# Patient Record
Sex: Male | Born: 1945 | Race: White | Hispanic: No | Marital: Single | Smoking: Never smoker
Health system: Southern US, Community
[De-identification: ages and names within clinical notes are randomized; demographics above are authoritative.]

## PROBLEM LIST (undated history)

## (undated) DIAGNOSIS — K759 Inflammatory liver disease, unspecified: Secondary | ICD-10-CM

## (undated) DIAGNOSIS — K219 Gastro-esophageal reflux disease without esophagitis: Secondary | ICD-10-CM

## (undated) DIAGNOSIS — M1990 Unspecified osteoarthritis, unspecified site: Secondary | ICD-10-CM

## (undated) HISTORY — PX: TONSILLECTOMY: SUR1361

---

## 1997-11-12 ENCOUNTER — Ambulatory Visit (HOSPITAL_BASED_OUTPATIENT_CLINIC_OR_DEPARTMENT_OTHER): Admission: RE | Admit: 1997-11-12 | Discharge: 1997-11-12 | Payer: Self-pay | Admitting: Orthopedic Surgery

## 2001-08-23 ENCOUNTER — Encounter: Payer: Self-pay | Admitting: Family Medicine

## 2001-08-23 ENCOUNTER — Encounter: Admission: RE | Admit: 2001-08-23 | Discharge: 2001-08-23 | Payer: Self-pay | Admitting: Family Medicine

## 2002-06-14 ENCOUNTER — Ambulatory Visit (HOSPITAL_BASED_OUTPATIENT_CLINIC_OR_DEPARTMENT_OTHER): Admission: RE | Admit: 2002-06-14 | Discharge: 2002-06-14 | Payer: Self-pay | Admitting: Orthopedic Surgery

## 2002-07-11 ENCOUNTER — Ambulatory Visit (HOSPITAL_COMMUNITY): Admission: RE | Admit: 2002-07-11 | Discharge: 2002-07-11 | Payer: Self-pay | Admitting: Gastroenterology

## 2002-07-11 ENCOUNTER — Encounter (INDEPENDENT_AMBULATORY_CARE_PROVIDER_SITE_OTHER): Payer: Self-pay | Admitting: Specialist

## 2004-11-03 ENCOUNTER — Encounter: Admission: RE | Admit: 2004-11-03 | Discharge: 2004-11-03 | Payer: Self-pay | Admitting: Family Medicine

## 2008-01-12 ENCOUNTER — Encounter: Admission: RE | Admit: 2008-01-12 | Discharge: 2008-01-12 | Payer: Self-pay | Admitting: Family Medicine

## 2008-01-14 ENCOUNTER — Encounter: Admission: RE | Admit: 2008-01-14 | Discharge: 2008-01-14 | Payer: Self-pay | Admitting: Family Medicine

## 2008-02-02 HISTORY — PX: JOINT REPLACEMENT: SHX530

## 2008-12-02 ENCOUNTER — Inpatient Hospital Stay (HOSPITAL_COMMUNITY): Admission: RE | Admit: 2008-12-02 | Discharge: 2008-12-04 | Payer: Self-pay | Admitting: Orthopedic Surgery

## 2010-05-06 LAB — PROTIME-INR
INR: 1.09 (ref 0.00–1.49)
INR: 1.33 (ref 0.00–1.49)
Prothrombin Time: 14 seconds (ref 11.6–15.2)
Prothrombin Time: 16.4 seconds — ABNORMAL HIGH (ref 11.6–15.2)

## 2010-05-06 LAB — CBC
HCT: 34.8 % — ABNORMAL LOW (ref 39.0–52.0)
HCT: 36 % — ABNORMAL LOW (ref 39.0–52.0)
Hemoglobin: 12.2 g/dL — ABNORMAL LOW (ref 13.0–17.0)
Hemoglobin: 12.7 g/dL — ABNORMAL LOW (ref 13.0–17.0)
MCHC: 35.1 g/dL (ref 30.0–36.0)
MCHC: 35.2 g/dL (ref 30.0–36.0)
MCV: 99.4 fL (ref 78.0–100.0)
MCV: 99.4 fL (ref 78.0–100.0)
Platelets: 206 10*3/uL (ref 150–400)
Platelets: 207 10*3/uL (ref 150–400)
RBC: 3.5 MIL/uL — ABNORMAL LOW (ref 4.22–5.81)
RBC: 3.62 MIL/uL — ABNORMAL LOW (ref 4.22–5.81)
RDW: 12.1 % (ref 11.5–15.5)
RDW: 12.1 % (ref 11.5–15.5)
WBC: 12.6 10*3/uL — ABNORMAL HIGH (ref 4.0–10.5)
WBC: 14.6 10*3/uL — ABNORMAL HIGH (ref 4.0–10.5)

## 2010-05-06 LAB — BASIC METABOLIC PANEL
BUN: 6 mg/dL (ref 6–23)
CO2: 30 mEq/L (ref 19–32)
Calcium: 8.3 mg/dL — ABNORMAL LOW (ref 8.4–10.5)
Chloride: 97 mEq/L (ref 96–112)
Creatinine, Ser: 1.13 mg/dL (ref 0.4–1.5)
GFR calc Af Amer: >60 mL/min (ref >60)
GFR calc non Af Amer: >60 mL/min (ref >60)
Glucose, Bld: 154 mg/dL — ABNORMAL HIGH (ref 70–99)
Potassium: 4.2 mEq/L (ref 3.5–5.1)
Sodium: 132 mEq/L — ABNORMAL LOW (ref 135–145)

## 2010-05-06 LAB — TYPE AND SCREEN
ABO/RH(D): O POS
Antibody Screen: NEGATIVE

## 2010-05-06 LAB — ABO/RH: ABO/RH(D): O POS

## 2010-05-07 LAB — CBC
HCT: 44 % (ref 39.0–52.0)
Hemoglobin: 15.5 g/dL (ref 13.0–17.0)
MCHC: 35.2 g/dL (ref 30.0–36.0)
MCV: 97.8 fL (ref 78.0–100.0)
Platelets: 269 10*3/uL (ref 150–400)
RBC: 4.5 MIL/uL (ref 4.22–5.81)
RDW: 12.2 % (ref 11.5–15.5)
WBC: 7.9 10*3/uL (ref 4.0–10.5)

## 2010-05-07 LAB — BASIC METABOLIC PANEL
BUN: 13 mg/dL (ref 6–23)
CO2: 29 mEq/L (ref 19–32)
Calcium: 9.6 mg/dL (ref 8.4–10.5)
Chloride: 106 mEq/L (ref 96–112)
Creatinine, Ser: 1.05 mg/dL (ref 0.4–1.5)
GFR calc Af Amer: >60 mL/min (ref >60)
GFR calc non Af Amer: >60 mL/min (ref >60)
Glucose, Bld: 99 mg/dL (ref 70–99)
Potassium: 4 mEq/L (ref 3.5–5.1)
Sodium: 141 mEq/L (ref 135–145)

## 2010-05-07 LAB — URINALYSIS, ROUTINE W REFLEX MICROSCOPIC
Bilirubin Urine: NEGATIVE
Glucose, UA: NEGATIVE mg/dL
Hgb urine dipstick: NEGATIVE
Ketones, ur: NEGATIVE mg/dL
Nitrite: NEGATIVE
Protein, ur: NEGATIVE mg/dL
Specific Gravity, Urine: 1.018 (ref 1.005–1.030)
Urobilinogen, UA: 0.2 mg/dL (ref 0.0–1.0)
pH: 8 (ref 5.0–8.0)

## 2010-05-07 LAB — DIFFERENTIAL
Basophils Absolute: 0 10*3/uL (ref 0.0–0.1)
Basophils Relative: 0 % (ref 0–1)
Eosinophils Absolute: 0.2 10*3/uL (ref 0.0–0.7)
Eosinophils Relative: 3 % (ref 0–5)
Lymphocytes Relative: 28 % (ref 12–46)
Lymphs Abs: 2.2 10*3/uL (ref 0.7–4.0)
Monocytes Absolute: 0.6 10*3/uL (ref 0.1–1.0)
Monocytes Relative: 8 % (ref 3–12)
Neutro Abs: 4.8 10*3/uL (ref 1.7–7.7)
Neutrophils Relative %: 61 % (ref 43–77)

## 2010-05-07 LAB — PROTIME-INR
INR: 0.89 (ref 0.00–1.49)
Prothrombin Time: 12 seconds (ref 11.6–15.2)

## 2010-05-07 LAB — APTT: aPTT: 27 seconds (ref 24–37)

## 2010-06-19 NOTE — Op Note (Signed)
NAME:  Walter Massey, Walter Massey                          ACCOUNT NO.:  [id number]   MEDICAL RECORD NO.:  [id number]                   PATIENT TYPE:  AMB   LOCATION:  DSC                                  FACILITY:  [facility]   PHYSICIAN:  Katy Fitch. Walter Massey., M.D.          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   OPERATION PERFORMED:  Dorsal arthrotomy of right thumb interphalangeal joint  with debridement of large mucous cyst and excision of kissing osteophytes on  adjacent surfaces of dorsal radial aspect of distal and proximal phalanges.  Also limited capsulectomy at ulnar aspect of interphalangeal joint.   SURGEON:  Katy Fitch. Sypher, M.D.   ASSISTANT:  Jonni Sanger, P.A.   ANESTHESIA:  0.25% Marcaine and 2% lidocaine metacarpal head level block of  right thumb supplemented with IV sedation.   SUPERVISING ANESTHESIOLOGIST:  Janetta Hora. Gelene Mink, M.D.   INDICATIONS FOR PROCEDURE:  The patient is a 65 year old man with a history  of increasing pain his right thumb interphalangeal joint and a mass adjacent  to the base of his thumb nail.  Clinical examination suggested IP joint  arthropathy, marginal osteophyte formation and a large mucous cyst.  He does  not have significant nail deformity.  He requested excision of his mass.  We  recommended DIP joint debridement.  We explained that we could not control  the pain of his arthritis with a DIP joint debridement but could typically  remove the cyst and  prevent its recurrent by joint debridement.   DESCRIPTION OF PROCEDURE:  The patient was brought to the operating room and  placed in supine position upon the operating table.  Following light  sedation, the right arm was prepped with Betadine soap and solution and  sterilely draped.  A pneumatic tourniquet was applied to the proximal  brachium.  Following exsanguination of the right arm with an Esmarch  bandage, an arterial tourniquet on the proximal brachium was inflated to  .  The procedure commenced with infiltration of 0.25% Marcaine and 2%  lidocaine at metacarpal head level to obtain visual block.  When anesthesia  was satisfactory, the procedure commenced with a curvilinear incision  exposing the extensor mechanism and IP joint. There was a large multilobular  ganglion deep to the extensor mechanism.  This was exposed by elevating the  radial insertion of the extensor pollicis longus and the ganglion was  removed piecemeal with a rongeur.  A large dorsocutaneous sensory branch was  retracted throughout  dissection of the joint capsule.  The adjacent  osteophytes on the distal and proximal phalanges on the radial aspect of the  joint were excised.  A synovectomy of the radial aspect of the joint was  accomplished.  All visible ganglion membrane was excised.  Attention was  then directed to the ulnar aspect of the joint.  The capsule between the  ulnar collateral ligament and extensor pollicis longus was excised with a  rongeur.  There were no significant osteophytes on the ulnar aspect of the  joint that would likely provoke the development of ganglion.  The extensor  that was taken down to remove the ganglion was repaired with a mattress  suture of 4-0 Vicryl.  The wound was then carefully irrigated and repaired  with intradermal 0 Prolene.   The patient tolerated surgery and anesthesia well.  He was awakened from  sedation and transferred to recovery room with stable vital  signs.                                               Katy Fitch Walter Massey., M.D.    RVS/MEDQ  D:  06/14/2002  T:  06/15/2002  Job:  [id number]

## 2010-06-19 NOTE — Op Note (Signed)
   NAME:  Walter Massey, Walter Massey                          ACCOUNT NO.:  [id number]   MEDICAL RECORD NO.:  [id number]                   PATIENT TYPE:  AMB   LOCATION:  ENDO                                 FACILITY:  [facility]   PHYSICIAN:  Danise Edge, M.D.                DATE OF BIRTH:  May 21, 1945   DATE OF PROCEDURE:  07/11/2002  DATE OF DISCHARGE:                                 OPERATIVE REPORT   PROCEDURE:  Colonoscopy.   INDICATIONS FOR PROCEDURE:  Mr. Walter Massey is a 65 year old male born 02-07-1945. [name] had a week long history of intermittent hematochezia in  late April 2004. He was evaluated by Dr. Donia Guiles and diagnosed with  an anal fissure which was treated. His hematochezia has resolved. Mr.  Outman is scheduled for diagnostic colonoscopy with polypectomy to prevent  colon cancer.   ENDOSCOPIST:  Charolett Bumpers, M.D.   PREMEDICATION:  Versed 5 mg, Demerol 50 mg .   DESCRIPTION OF PROCEDURE:  After obtaining informed consent, [name] was  placed in the left lateral decubitus position. I administered intravenous  Demerol and intravenous Versed to achieve conscious sedation for the  procedure. The patient's blood pressure, oxygen saturation and cardiac  rhythm were monitored throughout the procedure and documented in the medical  record.   Anal inspection was normal. Digital rectal exam was normal. The prostate was  nonnodular. The Olympus adult colonoscope was introduced into the rectum and  advanced to the cecum. A normal appearing ileocecal valve was intubated and  the distal ileum inspected. Colonic preparation for the exam today was  excellent.   RECTUM:  From the mid rectum, a 1 mm sessile polyp was removed with the  electrocautery snare and a 0.5 mm sessile polyp was removed with the hot  biopsy forceps.   SIGMOID COLON AND DESCENDING COLON:  Normal.   SPLENIC FLEXURE:  Normal.   TRANSVERSE COLON:  Normal.   HEPATIC FLEXURE:  Normal.   ASCENDING COLON:  Normal.   CECUM AND ILEOCECAL VALVE:  Normal.   DISTAL ILEUM:  Normal.   ASSESSMENT:  Two small polyps were removed from the mid rectum;  otherwise  normal proctocolonoscopy to the cecum.   RECOMMENDATIONS:  If rectal polyps return neoplastic pathologically, Mr.  Larue should undergo a repeat colonoscopy in five years.                                               Danise Edge, M.D.    MJ/MEDQ  D:  07/11/2002  T:  07/11/2002  Job:  [id number]   cc:   Donia Guiles, M.D.  [address] [place]  [place] [address]  Fax: [phone]

## 2010-12-13 IMAGING — CR DG HIP 1V PORT*R*
1 series · 1 of 1 positions shown · non-contrast
Comparison: Preoperative radiographs 11/03/2004.

CLINICAL DATA: Postop right hip arthroplasty.

PORTABLE RIGHT HIP - 1 VIEW

[cross table lat hip]
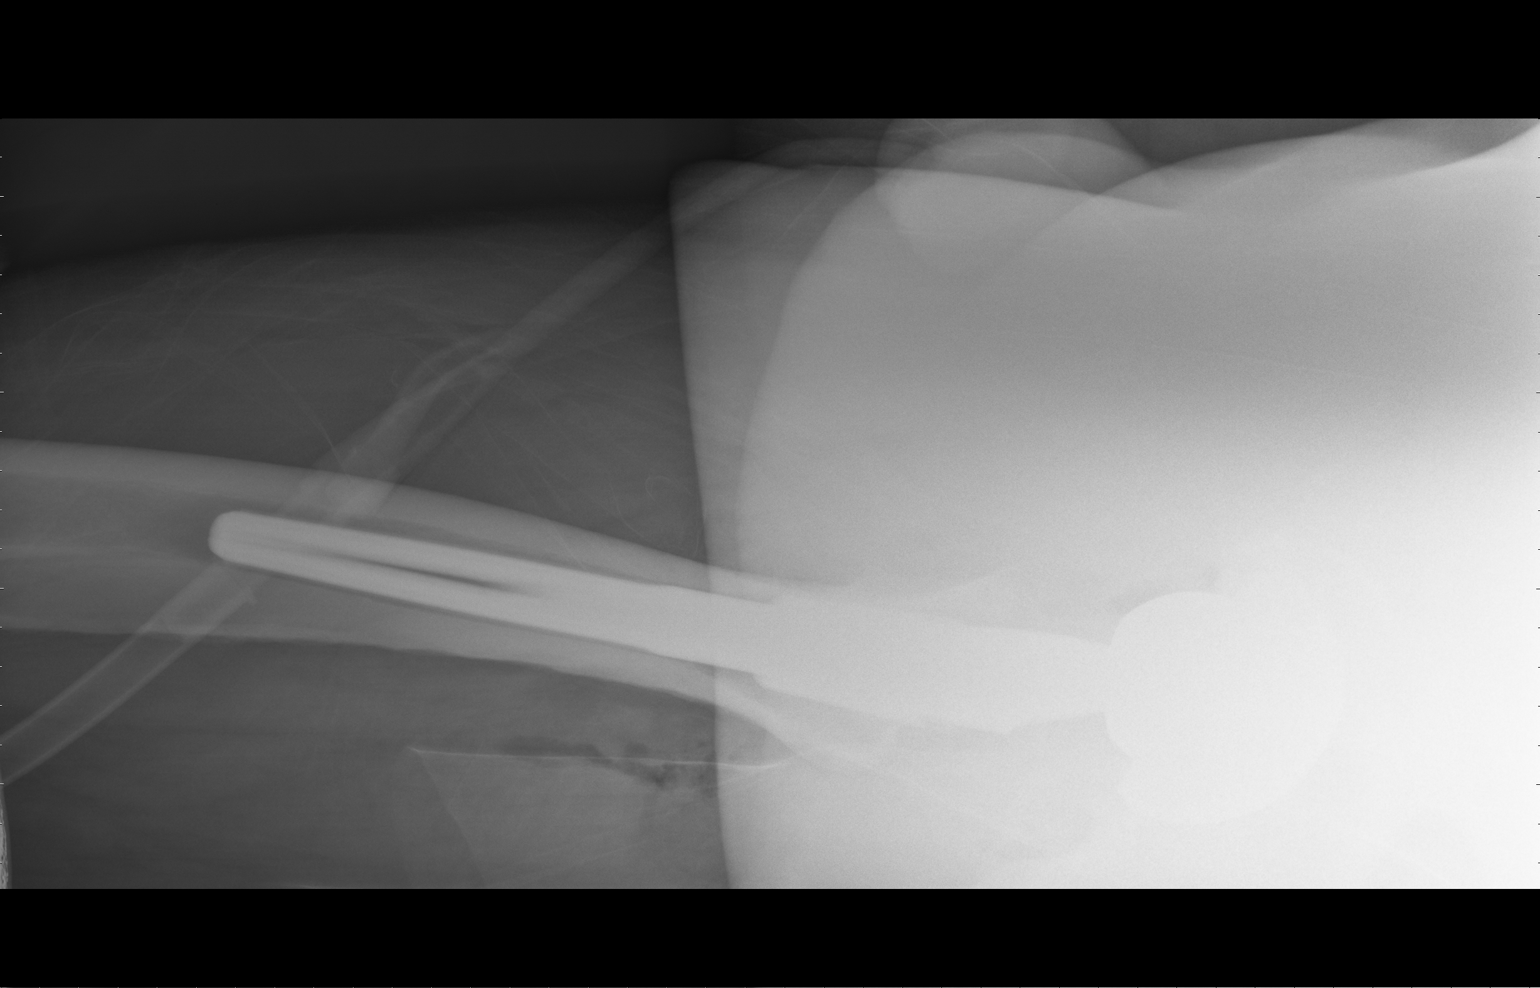

[1 of 1 positions shown; findings below may reference images not displayed]

FINDINGS: Cross-table lateral view at 5355 hours demonstrates a
right total hip arthroplasty without demonstrated complication.
There is no dislocation.  Some air is seen within the soft tissues
posterior to the proximal femur.
IMPRESSION: No demonstrated complication status post right total hip
replacement.

## 2011-05-19 ENCOUNTER — Other Ambulatory Visit: Payer: Self-pay | Admitting: Gastroenterology

## 2011-05-21 ENCOUNTER — Ambulatory Visit
Admission: RE | Admit: 2011-05-21 | Discharge: 2011-05-21 | Disposition: A | Discharge status: Home / Self Care | Payer: BC Managed Care – PPO | Source: Ambulatory Visit | Attending: Gastroenterology | Admitting: Gastroenterology

## 2012-02-02 HISTORY — PX: BACK SURGERY: SHX140

## 2012-09-07 ENCOUNTER — Other Ambulatory Visit: Payer: Self-pay | Admitting: Physical Medicine and Rehabilitation

## 2012-09-07 DIAGNOSIS — M545 Low back pain: Secondary | ICD-10-CM

## 2012-09-11 ENCOUNTER — Ambulatory Visit
Admission: RE | Admit: 2012-09-11 | Discharge: 2012-09-11 | Disposition: A | Discharge status: Home / Self Care | Payer: BC Managed Care – PPO | Source: Ambulatory Visit | Attending: Physical Medicine and Rehabilitation | Admitting: Physical Medicine and Rehabilitation

## 2012-09-11 VITALS — BP 154/87 | HR 63

## 2012-09-11 DIAGNOSIS — M545 Low back pain: Secondary | ICD-10-CM

## 2012-09-11 MED ORDER — IOHEXOL 180 MG/ML  SOLN
1.0000 mL | Freq: Once | INTRAMUSCULAR | Status: AC | PRN
  Administered 2012-09-11: 1 mL via EPIDURAL

## 2012-09-11 MED ORDER — METHYLPREDNISOLONE ACETATE 40 MG/ML INJ SUSP (RADIOLOG
120.0000 mg | Freq: Once | INTRAMUSCULAR | Status: AC
  Administered 2012-09-11: 120 mg via EPIDURAL

## 2012-11-20 ENCOUNTER — Other Ambulatory Visit: Payer: Self-pay | Admitting: Family Medicine

## 2012-11-20 ENCOUNTER — Ambulatory Visit
Admission: RE | Admit: 2012-11-20 | Discharge: 2012-11-20 | Disposition: A | Discharge status: Home / Self Care | Payer: BC Managed Care – PPO | Source: Ambulatory Visit | Attending: Family Medicine | Admitting: Family Medicine

## 2012-11-20 DIAGNOSIS — R05 Cough: Secondary | ICD-10-CM

## 2012-11-20 DIAGNOSIS — R918 Other nonspecific abnormal finding of lung field: Secondary | ICD-10-CM

## 2012-12-14 ENCOUNTER — Other Ambulatory Visit: Payer: Self-pay | Admitting: Orthopedic Surgery

## 2012-12-20 ENCOUNTER — Encounter (HOSPITAL_COMMUNITY): Payer: Self-pay | Admitting: Pharmacy Technician

## 2012-12-23 NOTE — Pre-Procedure Instructions (Addendum)
ADDEN STROUT  12/23/2012   Your procedure is scheduled on:  December 3  Report to [facility] Entrance "A" [address] at [employer] AM.  Call this number if you have problems the morning of surgery: [phone]   Remember:   Do not eat food or drink liquids after midnight.   Take these medicines the morning of surgery with A SIP OF WATER: None   STOP Aspirin, Etodolac, Multiple Vitamins November 26   STOP/ Do not take Aspirin, Aleve, Naproxen, Advil, Ibuprofen, Vitamin, Herbs, or Supplements starting November 26   Do not wear jewelry, make-up or nail polish.  Do not wear lotions, powders, or perfumes. You may wear deodorant.  Do not shave 48 hours prior to surgery. Men may shave face and neck.  Do not bring valuables to the hospital.  [facility] is not responsible                  for any belongings or valuables.               Contacts, dentures or bridgework may not be worn into surgery.  Leave suitcase in the car. After surgery it may be brought to your room.  For patients admitted to the hospital, discharge time is determined by your                treatment team.               Special Instructions: Shower using CHG 2 nights before surgery and the night before surgery.  If you shower the day of surgery use CHG.  Use special wash - you have one bottle of CHG for all showers.  You should use approximately 1/3 of the bottle for each shower.   Please read over the following fact sheets that you were given: Pain Booklet, Coughing and Deep Breathing, Blood Transfusion Information and Surgical Site Infection Prevention

## 2012-12-25 ENCOUNTER — Encounter (HOSPITAL_COMMUNITY)
Admission: RE | Admit: 2012-12-25 | Discharge: 2012-12-25 | Disposition: A | Discharge status: Home / Self Care | Payer: BC Managed Care – PPO | Source: Ambulatory Visit | Attending: Orthopedic Surgery | Admitting: Orthopedic Surgery

## 2012-12-25 ENCOUNTER — Encounter (HOSPITAL_COMMUNITY): Payer: Self-pay

## 2012-12-25 ENCOUNTER — Ambulatory Visit (HOSPITAL_COMMUNITY)
Admission: RE | Admit: 2012-12-25 | Discharge: 2012-12-25 | Disposition: A | Discharge status: Home / Self Care | Payer: BC Managed Care – PPO | Source: Ambulatory Visit | Attending: Orthopedic Surgery | Admitting: Orthopedic Surgery

## 2012-12-25 DIAGNOSIS — Z01818 Encounter for other preprocedural examination: Secondary | ICD-10-CM | POA: Insufficient documentation

## 2012-12-25 DIAGNOSIS — M47817 Spondylosis without myelopathy or radiculopathy, lumbosacral region: Secondary | ICD-10-CM | POA: Insufficient documentation

## 2012-12-25 HISTORY — DX: Gastro-esophageal reflux disease without esophagitis: K21.9

## 2012-12-25 HISTORY — DX: Inflammatory liver disease, unspecified: K75.9

## 2012-12-25 LAB — COMPREHENSIVE METABOLIC PANEL
ALT: 44 U/L (ref 0–53)
Albumin: 4.1 g/dL (ref 3.5–5.2)
Alkaline Phosphatase: 59 U/L (ref 39–117)
BUN: 15 mg/dL (ref 6–23)
CO2: 26 mEq/L (ref 19–32)
Chloride: 101 mEq/L (ref 96–112)
GFR calc non Af Amer: 71 mL/min — ABNORMAL LOW (ref >90)
Glucose, Bld: 114 mg/dL — ABNORMAL HIGH (ref 70–99)
Potassium: 3.6 mEq/L (ref 3.5–5.1)
Total Bilirubin: 0.6 mg/dL (ref 0.3–1.2)

## 2012-12-25 LAB — TYPE AND SCREEN

## 2012-12-25 LAB — URINALYSIS, ROUTINE W REFLEX MICROSCOPIC
Bilirubin Urine: NEGATIVE
Leukocytes, UA: NEGATIVE
Nitrite: NEGATIVE
Protein, ur: NEGATIVE mg/dL
Specific Gravity, Urine: 1.02 (ref 1.005–1.030)
pH: 6.5 (ref 5.0–8.0)

## 2012-12-25 LAB — PROTIME-INR
INR: 0.94 (ref 0.00–1.49)
Prothrombin Time: 12.4 seconds (ref 11.6–15.2)

## 2012-12-25 LAB — CBC WITH DIFFERENTIAL/PLATELET
Basophils Relative: 0 % (ref 0–1)
Hemoglobin: 15.9 g/dL (ref 13.0–17.0)
Lymphs Abs: 2.1 10*3/uL (ref 0.7–4.0)
MCV: 95.2 fL (ref 78.0–100.0)
Monocytes Relative: 7 % (ref 3–12)
Neutro Abs: 4.8 10*3/uL (ref 1.7–7.7)
Neutrophils Relative %: 63 % (ref 43–77)
RBC: 4.61 MIL/uL (ref 4.22–5.81)

## 2012-12-25 LAB — SURGICAL PCR SCREEN
MRSA, PCR: NEGATIVE
Staphylococcus aureus: NEGATIVE

## 2012-12-25 LAB — APTT: aPTT: 28 seconds (ref 24–37)

## 2012-12-25 NOTE — Progress Notes (Signed)
Call to A. Zelenak, PAC, reported EKG result from today.

## 2012-12-25 NOTE — Progress Notes (Signed)
Pt. Reports that he thinks he has had a prior ekg, call to [facility], but no ekg readily available. Last seen in their office 11/2012. Pt. Denies chest pain or SOB.

## 2012-12-25 NOTE — Progress Notes (Signed)
Pt. Denies chest pain, SOB,  changes in breathing; all difficulties related to cardiac disease.  Last ekg, [facility],n not sure when

## 2012-12-26 NOTE — Progress Notes (Signed)
Anesthesia Chart Review:  Patient is a 67 year old male scheduled for L3-4, L4-5 decompression on 01/03/13 by Dr. Yevette Edwards.  History includes non-smoker, GERD, Hepatitis B, tonsillectomy, right THA 12/02/08.  BMI 31.5. PCP is listed as Dr. Lupita Raider.  EKG on 12/25/12 showed NSR, LAD, incomplete right BBB, inferior infarct (age undetermined), anterior infarct (age undetermined).  EKG was not felt significantly changed since his 11/26/08 EKG.  No CV symptoms were reported at his PAT visit.  CXR on 11/20/12 showed: The heart size and mediastinal contours are within normal limits. Both lungs are clear. The visualized skeletal structures are unremarkable. IMPRESSION: No active cardiopulmonary disease.   Preoperative labs noted.  Patient's EKG has been stable for at least four years.  He has since tolerated a total hip replacement.  He denied CV symptoms and has no known CAD/MI, CHF, DM history.  He will be further evaluated by his assigned anesthesiologist on the day of surgery, but if no acute changes or new CV symptomology then I would anticipate that he could proceed as planned.    Velna Ochs [facility] Short Stay Center/Anesthesiology Phone [phone] 12/26/2012 9:40 AM

## 2013-01-02 MED ORDER — CEFAZOLIN SODIUM-DEXTROSE 2-3 GM-% IV SOLR
2.0000 g | INTRAVENOUS | Status: AC
  Administered 2013-01-03: 2 g via INTRAVENOUS
  Filled 2013-01-02: qty 50

## 2013-01-02 MED ORDER — POVIDONE-IODINE 7.5 % EX SOLN
Freq: Once | CUTANEOUS | Status: DC
  Filled 2013-01-02: qty 118

## 2013-01-03 ENCOUNTER — Encounter (HOSPITAL_COMMUNITY): Payer: BC Managed Care – PPO | Admitting: Vascular Surgery

## 2013-01-03 ENCOUNTER — Ambulatory Visit (HOSPITAL_COMMUNITY): Payer: BC Managed Care – PPO

## 2013-01-03 ENCOUNTER — Ambulatory Visit (HOSPITAL_COMMUNITY)
Admission: RE | Admit: 2013-01-03 | Discharge: 2013-01-03 | Disposition: A | Discharge status: Home / Self Care | Payer: BC Managed Care – PPO | Source: Ambulatory Visit | Attending: Orthopedic Surgery | Admitting: Orthopedic Surgery

## 2013-01-03 ENCOUNTER — Encounter (HOSPITAL_COMMUNITY): Payer: Self-pay | Admitting: *Deleted

## 2013-01-03 ENCOUNTER — Encounter (HOSPITAL_COMMUNITY)
Admission: RE | Disposition: A | Discharge status: Home / Self Care | Payer: Self-pay | Source: Ambulatory Visit | Attending: Orthopedic Surgery

## 2013-01-03 ENCOUNTER — Ambulatory Visit (HOSPITAL_COMMUNITY): Payer: BC Managed Care – PPO | Admitting: Anesthesiology

## 2013-01-03 DIAGNOSIS — Z8619 Personal history of other infectious and parasitic diseases: Secondary | ICD-10-CM | POA: Insufficient documentation

## 2013-01-03 DIAGNOSIS — K219 Gastro-esophageal reflux disease without esophagitis: Secondary | ICD-10-CM | POA: Insufficient documentation

## 2013-01-03 DIAGNOSIS — Z7982 Long term (current) use of aspirin: Secondary | ICD-10-CM | POA: Insufficient documentation

## 2013-01-03 DIAGNOSIS — M48061 Spinal stenosis, lumbar region without neurogenic claudication: Secondary | ICD-10-CM

## 2013-01-03 HISTORY — PX: DECOMPRESSIVE LUMBAR LAMINECTOMY LEVEL 2: SHX5792

## 2013-01-03 SURGERY — DECOMPRESSIVE LUMBAR LAMINECTOMY LEVEL 2
Anesthesia: General | Site: Spine Lumbar

## 2013-01-03 MED ORDER — METHYLPREDNISOLONE ACETATE 40 MG/ML IJ SUSP
INTRAMUSCULAR | Status: AC
  Filled 2013-01-03: qty 1

## 2013-01-03 MED ORDER — FENTANYL CITRATE 0.05 MG/ML IJ SOLN
INTRAMUSCULAR | Status: DC | PRN
  Administered 2013-01-03 (×4): 50 ug via INTRAVENOUS
  Administered 2013-01-03: 100 ug via INTRAVENOUS

## 2013-01-03 MED ORDER — ONDANSETRON HCL 4 MG/2ML IJ SOLN: 4.0000 mg | Freq: Once | INTRAMUSCULAR | Status: DC | PRN

## 2013-01-03 MED ORDER — METHYLPREDNISOLONE ACETATE 40 MG/ML IJ SUSP
INTRAMUSCULAR | Status: DC | PRN
  Administered 2013-01-03: 40 mg via INTRA_ARTICULAR

## 2013-01-03 MED ORDER — VECURONIUM BROMIDE 10 MG IV SOLR
INTRAVENOUS | Status: DC | PRN
  Administered 2013-01-03 (×2): 2 mg via INTRAVENOUS

## 2013-01-03 MED ORDER — PHENYLEPHRINE HCL 10 MG/ML IJ SOLN
INTRAMUSCULAR | Status: DC | PRN
  Administered 2013-01-03: 40 ug via INTRAVENOUS
  Administered 2013-01-03: 80 ug via INTRAVENOUS
  Administered 2013-01-03: 40 ug via INTRAVENOUS
  Administered 2013-01-03 (×2): 80 ug via INTRAVENOUS

## 2013-01-03 MED ORDER — LIDOCAINE HCL (CARDIAC) 20 MG/ML IV SOLN
INTRAVENOUS | Status: DC | PRN
  Administered 2013-01-03: 40 mg via INTRAVENOUS

## 2013-01-03 MED ORDER — OXYCODONE-ACETAMINOPHEN 5-325 MG PO TABS
2.0000 | ORAL_TABLET | ORAL | Status: AC
  Administered 2013-01-03: 2 via ORAL

## 2013-01-03 MED ORDER — THROMBIN 20000 UNITS EX SOLR
CUTANEOUS | Status: DC | PRN
  Administered 2013-01-03: 09:00:00 via TOPICAL

## 2013-01-03 MED ORDER — ARTIFICIAL TEARS OP OINT
TOPICAL_OINTMENT | OPHTHALMIC | Status: DC | PRN
  Administered 2013-01-03: 1 via OPHTHALMIC

## 2013-01-03 MED ORDER — THROMBIN 20000 UNITS EX SOLR
CUTANEOUS | Status: AC
  Filled 2013-01-03: qty 20000

## 2013-01-03 MED ORDER — MIDAZOLAM HCL 5 MG/5ML IJ SOLN
INTRAMUSCULAR | Status: DC | PRN
  Administered 2013-01-03: 2 mg via INTRAVENOUS

## 2013-01-03 MED ORDER — OXYCODONE-ACETAMINOPHEN 5-325 MG PO TABS
ORAL_TABLET | ORAL | Status: AC
  Filled 2013-01-03: qty 2

## 2013-01-03 MED ORDER — HEMOSTATIC AGENTS (NO CHARGE) OPTIME
TOPICAL | Status: DC | PRN
  Administered 2013-01-03: 1 via TOPICAL

## 2013-01-03 MED ORDER — METHYLENE BLUE 1 % INJ SOLN
INTRAMUSCULAR | Status: AC
  Filled 2013-01-03: qty 10

## 2013-01-03 MED ORDER — GLYCOPYRROLATE 0.2 MG/ML IJ SOLN
INTRAMUSCULAR | Status: DC | PRN
  Administered 2013-01-03: .5 mg via INTRAVENOUS

## 2013-01-03 MED ORDER — HYDROMORPHONE HCL PF 1 MG/ML IJ SOLN
INTRAMUSCULAR | Status: AC
  Filled 2013-01-03: qty 1

## 2013-01-03 MED ORDER — ONDANSETRON HCL 4 MG/2ML IJ SOLN
INTRAMUSCULAR | Status: DC | PRN
  Administered 2013-01-03: 4 mg via INTRAVENOUS

## 2013-01-03 MED ORDER — NEOSTIGMINE METHYLSULFATE 1 MG/ML IJ SOLN
INTRAMUSCULAR | Status: DC | PRN
  Administered 2013-01-03: 3 mg via INTRAVENOUS

## 2013-01-03 MED ORDER — BUPIVACAINE-EPINEPHRINE 0.25% -1:200000 IJ SOLN
INTRAMUSCULAR | Status: DC | PRN
  Administered 2013-01-03: 30 mL

## 2013-01-03 MED ORDER — BUPIVACAINE-EPINEPHRINE (PF) 0.25% -1:200000 IJ SOLN
INTRAMUSCULAR | Status: AC
  Filled 2013-01-03: qty 30

## 2013-01-03 MED ORDER — PHENYLEPHRINE HCL 10 MG/ML IJ SOLN
10.0000 mg | INTRAVENOUS | Status: DC | PRN
  Administered 2013-01-03: 25 ug/min via INTRAVENOUS

## 2013-01-03 MED ORDER — ROCURONIUM BROMIDE 100 MG/10ML IV SOLN
INTRAVENOUS | Status: DC | PRN
  Administered 2013-01-03: 50 mg via INTRAVENOUS

## 2013-01-03 MED ORDER — LACTATED RINGERS IV SOLN
INTRAVENOUS | Status: DC | PRN
  Administered 2013-01-03 (×3): via INTRAVENOUS

## 2013-01-03 MED ORDER — HYDROMORPHONE HCL PF 1 MG/ML IJ SOLN
0.2500 mg | INTRAMUSCULAR | Status: DC | PRN
  Administered 2013-01-03: 1 mg via INTRAVENOUS

## 2013-01-03 MED ORDER — ALBUMIN HUMAN 5 % IV SOLN
INTRAVENOUS | Status: DC | PRN
  Administered 2013-01-03 (×2): via INTRAVENOUS

## 2013-01-03 MED ORDER — METHYLENE BLUE 1 % INJ SOLN
INTRAMUSCULAR | Status: DC | PRN
  Administered 2013-01-03: 1 mL

## 2013-01-03 MED ORDER — PROPOFOL 10 MG/ML IV BOLUS
INTRAVENOUS | Status: DC | PRN
  Administered 2013-01-03: 180 mg via INTRAVENOUS

## 2013-01-03 SURGICAL SUPPLY — 62 items
BENZOIN TINCTURE PRP APPL 2/3 (GAUZE/BANDAGES/DRESSINGS) ×2 IMPLANT
BUR ROUND PRECISION 4.0 (BURR) ×2 IMPLANT
CANISTER SUCTION 2500CC (MISCELLANEOUS) ×2 IMPLANT
CARTRIDGE OIL MAESTRO DRILL (MISCELLANEOUS) ×1 IMPLANT
CLOTH BEACON ORANGE TIMEOUT ST (SAFETY) IMPLANT
CLSR STERI-STRIP ANTIMIC 1/2X4 (GAUZE/BANDAGES/DRESSINGS) ×2 IMPLANT
CONT SPEC STER OR (MISCELLANEOUS) IMPLANT
CORDS BIPOLAR (ELECTRODE) ×2 IMPLANT
COVER SURGICAL LIGHT HANDLE (MISCELLANEOUS) ×2 IMPLANT
DIFFUSER DRILL AIR PNEUMATIC (MISCELLANEOUS) ×2 IMPLANT
DRAIN CHANNEL 15F RND FF W/TCR (WOUND CARE) IMPLANT
DRAPE POUCH INSTRU U-SHP 10X18 (DRAPES) ×2 IMPLANT
DRAPE SURG 17X23 STRL (DRAPES) ×8 IMPLANT
DURAPREP 26ML APPLICATOR (WOUND CARE) ×2 IMPLANT
ELECT BLADE 4.0 EZ CLEAN MEGAD (MISCELLANEOUS)
ELECT BLADE 6.5 EXT (BLADE) IMPLANT
ELECT CAUTERY BLADE 6.4 (BLADE) ×2 IMPLANT
ELECT REM PT RETURN 9FT ADLT (ELECTROSURGICAL) ×2
ELECTRODE BLDE 4.0 EZ CLN MEGD (MISCELLANEOUS) IMPLANT
ELECTRODE REM PT RTRN 9FT ADLT (ELECTROSURGICAL) ×1 IMPLANT
EVACUATOR SILICONE 100CC (DRAIN) IMPLANT
FILTER STRAW FLUID ASPIR (MISCELLANEOUS) ×2 IMPLANT
GAUZE SPONGE 4X4 16PLY XRAY LF (GAUZE/BANDAGES/DRESSINGS) ×4 IMPLANT
GLOVE BIO SURGEON STRL SZ8 (GLOVE) ×2 IMPLANT
GLOVE BIOGEL PI IND STRL 8 (GLOVE) ×1 IMPLANT
GLOVE BIOGEL PI INDICATOR 8 (GLOVE) ×1
GOWN STRL NON-REIN LRG LVL3 (GOWN DISPOSABLE) ×4 IMPLANT
IV CATH 14GX2 1/4 (CATHETERS) ×2 IMPLANT
KIT BASIN OR (CUSTOM PROCEDURE TRAY) ×2 IMPLANT
KIT ROOM TURNOVER OR (KITS) ×2 IMPLANT
NEEDLE 18GX1X1/2 (RX/OR ONLY) (NEEDLE) ×4 IMPLANT
NEEDLE 22X1 1/2 (OR ONLY) (NEEDLE) ×2 IMPLANT
NEEDLE HYPO 25GX1X1/2 BEV (NEEDLE) ×2 IMPLANT
NEEDLE SPNL 18GX3.5 QUINCKE PK (NEEDLE) ×8 IMPLANT
NS IRRIG 1000ML POUR BTL (IV SOLUTION) ×6 IMPLANT
OIL CARTRIDGE MAESTRO DRILL (MISCELLANEOUS) ×2
PACK LAMINECTOMY ORTHO (CUSTOM PROCEDURE TRAY) ×2 IMPLANT
PACK UNIVERSAL I (CUSTOM PROCEDURE TRAY) ×2 IMPLANT
PAD ARMBOARD 7.5X6 YLW CONV (MISCELLANEOUS) ×4 IMPLANT
PATTIES SURGICAL .5 X.5 (GAUZE/BANDAGES/DRESSINGS) IMPLANT
PATTIES SURGICAL .5 X1 (DISPOSABLE) ×4 IMPLANT
SPONGE GAUZE 4X4 12PLY (GAUZE/BANDAGES/DRESSINGS) ×2 IMPLANT
SPONGE INTESTINAL PEANUT (DISPOSABLE) ×2 IMPLANT
STRIP CLOSURE SKIN 1/2X4 (GAUZE/BANDAGES/DRESSINGS) IMPLANT
SURGIFLO TRUKIT (HEMOSTASIS) ×2 IMPLANT
SUT ETHILON 2 0 FS 18 (SUTURE) IMPLANT
SUT VIC AB 0 CT1 27 (SUTURE)
SUT VIC AB 0 CT1 27XBRD ANBCTR (SUTURE) IMPLANT
SUT VIC AB 1 CT1 18XCR BRD 8 (SUTURE) ×1 IMPLANT
SUT VIC AB 1 CT1 8-18 (SUTURE) ×1
SUT VIC AB 2-0 CT2 18 VCP726D (SUTURE) ×2 IMPLANT
SYR 20CC LL (SYRINGE) IMPLANT
SYR 50ML SLIP (SYRINGE) IMPLANT
SYR BULB IRRIGATION 50ML (SYRINGE) ×2 IMPLANT
SYR CONTROL 10ML LL (SYRINGE) ×2 IMPLANT
SYR TB 1ML 26GX3/8 SAFETY (SYRINGE) ×4 IMPLANT
SYR TB 1ML LUER SLIP (SYRINGE) ×4 IMPLANT
TAPE CLOTH SURG 4X10 WHT LF (GAUZE/BANDAGES/DRESSINGS) ×2 IMPLANT
TOWEL OR 17X24 6PK STRL BLUE (TOWEL DISPOSABLE) ×2 IMPLANT
TOWEL OR 17X26 10 PK STRL BLUE (TOWEL DISPOSABLE) ×2 IMPLANT
WATER STERILE IRR 1000ML POUR (IV SOLUTION) IMPLANT
YANKAUER SUCT BULB TIP NO VENT (SUCTIONS) ×4 IMPLANT

## 2013-01-03 NOTE — Preoperative (Signed)
Beta Blockers   Reason not to administer Beta Blockers:Not Applicable 

## 2013-01-03 NOTE — Progress Notes (Signed)
Foley rem in pacu 1505

## 2013-01-03 NOTE — Progress Notes (Signed)
Walter Massey Patient unable to void, fluids pushed, dr Yevette Edwards called. Instructed to push fluids. No discomfort at this time

## 2013-01-03 NOTE — Progress Notes (Addendum)
Pt. Presented a cough and states it's gotten  Worst in the past 2 days. Denies fever. Pt. Has nasal  Congestion.. cxr done 11/20/12 due to congestion and cough which was normal. Notified Dr. Noreene Larsson .   Sacral foam dressing not needed.

## 2013-01-03 NOTE — H&P (Signed)
PREOPERATIVE H&P  Chief Complaint: bilateral leg pain  HPI: Walter Massey is a 67 y.o. male who presents with ongoing bilateral leg pain. MRI reveals stenosis L3-5. Patient has failed appropriate conservative care.  Past Medical History  Diagnosis Date  . GERD (gastroesophageal reflux disease)   . Hepatitis     HEP B- history of   Past Surgical History  Procedure Laterality Date  . Joint replacement  2010    R hip  . Tonsillectomy     History   Social History  . Marital Status: Single    Spouse Name: N/A    Number of Children: N/A  . Years of Education: N/A   Social History Main Topics  . Smoking status: Never Smoker   . Smokeless tobacco: None  . Alcohol Use: Yes     Comment: 1-2 beers per day   . Drug Use: No  . Sexual Activity: None   Other Topics Concern  . None   Social History Narrative  . None   History reviewed. No pertinent family history. No Known Allergies Prior to Admission medications   Medication Sig Start Date End Date Taking? Authorizing Provider  aspirin EC 81 MG tablet Take 81 mg by mouth daily.   Yes Historical Provider, MD  diphenhydramine-acetaminophen (TYLENOL PM) 25-500 MG TABS Take 1 tablet by mouth at bedtime as needed.   Yes Historical Provider, MD  ETODOLAC PO Take 1 tablet by mouth 2 (two) times daily.   Yes Historical Provider, MD  Multiple Vitamins-Minerals (CENTRUM PO) Take 1 tablet by mouth daily.   Yes Historical Provider, MD  omeprazole (PRILOSEC) 20 MG capsule Take 20 mg by mouth daily with breakfast.   Yes Historical Provider, MD     All other systems have been reviewed and were otherwise negative with the exception of those mentioned in the HPI and as above.  Physical Exam: Filed Vitals:   01/03/13 0628  BP: 156/88  Pulse: 82  Temp: 98 F (36.7 C)  Resp: 20    General: Alert, no acute distress Cardiovascular: No pedal edema Respiratory: No cyanosis, no use of accessory musculature Skin: No lesions in the area of  chief complaint Neurologic: Sensation intact distally Psychiatric: Patient is competent for consent with normal mood and affect Lymphatic: No axillary or cervical lymphadenopathy   Assessment/Plan: leg pain Plan for Procedure(s): DECOMPRESSIVE LUMBAR LAMINECTOMY LEVEL 2   Emilee Hero, MD 01/03/2013 8:24 AM

## 2013-01-03 NOTE — Progress Notes (Signed)
67 year old male underwent L3-4, L4-5 decompression by Dr. Yevette Edwards. Surgery was uneventful. Following surgery while patient was in prone position while dressing were being applied, patient had transient episode of bradycardia (HR 30s rhythm undetermined from strip, possible complete heart block. The episode lasted less than one minute, and he subsequently resumed NSR at rate 90-100.   He subsequently emerged from anesthesia and has maintained a stable heart rhythm with HR 75-100 in SR with no further bradycardia.  Now awake and alert, pain well controlled no SOB  VS: T-36.3 BP 119/64 HR 102 RR 21 O2 sat 96% on RA  Heart- RRR Lungs- clear no wheezes no crackles.  Patient appears clinically stable post lumbar laminectomy, one transient episode of bradycardia upon emergence from anesthesia. Pt OK for discharge  Kipp Brood, MD

## 2013-01-03 NOTE — Anesthesia Postprocedure Evaluation (Signed)
  Anesthesia Post-op Note  Patient: Walter Massey  Procedure(s) Performed: Procedure(s) with comments: DECOMPRESSIVE LUMBAR LAMINECTOMY LEVEL 2 (N/A) - Lumbar 3-4, lumbar 4-5 decompression. Clean class case.  Patient Location: PACU  Anesthesia Type:General  Level of Consciousness: awake  Airway and Oxygen Therapy: Patient Spontanous Breathing  Post-op Pain: mild  Post-op Assessment: Post-op Vital signs reviewed, Patient's Cardiovascular Status Stable, Respiratory Function Stable, Patent Airway, No signs of Nausea or vomiting and Pain level controlled  Post-op Vital Signs: Reviewed and stable  Complications: No apparent anesthesia complications

## 2013-01-03 NOTE — Anesthesia Procedure Notes (Signed)
Procedure Name: Intubation Date/Time: 01/03/2013 8:36 AM Performed by: Lovie Chol Pre-anesthesia Checklist: Patient identified, Emergency Drugs available, Suction available, Patient being monitored and Timeout performed Patient Re-evaluated:Patient Re-evaluated prior to inductionOxygen Delivery Method: Circle system utilized Preoxygenation: Pre-oxygenation with 100% oxygen Intubation Type: IV induction Ventilation: Mask ventilation without difficulty and Oral airway inserted - appropriate to patient size Laryngoscope Size: Miller and 2 Grade View: Grade II Tube type: Oral Tube size: 7.5 mm Number of attempts: 1 Airway Equipment and Method: Stylet Placement Confirmation: ETT inserted through vocal cords under direct vision,  positive ETCO2,  CO2 detector and breath sounds checked- equal and bilateral Secured at: 22 cm Tube secured with: Tape Dental Injury: Teeth and Oropharynx as per pre-operative assessment

## 2013-01-03 NOTE — Anesthesia Preprocedure Evaluation (Addendum)
Anesthesia Evaluation  Patient identified by MRN, date of birth, ID band Patient awake    Reviewed: Allergy & Precautions, H&P , NPO status , Patient's Chart, lab work & pertinent test results  History of Anesthesia Complications Negative for: history of anesthetic complications  Airway Mallampati: II TM Distance: >3 FB Neck ROM: Full    Dental  (+) Teeth Intact and Dental Advisory Given   Pulmonary Recent URI , Residual Cough,  breath sounds clear to auscultation        Cardiovascular negative cardio ROS  Rhythm:Regular Rate:Normal     Neuro/Psych    GI/Hepatic GERD-  Medicated and Controlled,(+)     substance abuse  alcohol use, Hepatitis -, B  Endo/Other  negative endocrine ROS  Renal/GU      Musculoskeletal   Abdominal (+) + obese,   Peds  Hematology   Anesthesia Other Findings   Reproductive/Obstetrics negative OB ROS                          Anesthesia Physical Anesthesia Plan  ASA: II  Anesthesia Plan: General   Post-op Pain Management:    Induction: Intravenous  Airway Management Planned: Oral ETT  Additional Equipment:   Intra-op Plan:   Post-operative Plan: Extubation in OR  Informed Consent: I have reviewed the patients History and Physical, chart, labs and discussed the procedure including the risks, benefits and alternatives for the proposed anesthesia with the patient or authorized representative who has indicated his/her understanding and acceptance.   Dental advisory given  Plan Discussed with: CRNA and Anesthesiologist  Anesthesia Plan Comments: (HNP L3-4, L4-5 GERD Obesity H/O cough, lungs clear, afebrile, WBC normal   Plan GA with oral ETT  Kipp Brood, MD)        Anesthesia Quick Evaluation

## 2013-01-03 NOTE — Transfer of Care (Signed)
Immediate Anesthesia Transfer of Care Note  Patient: Walter Massey  Procedure(s) Performed: Procedure(s) with comments: DECOMPRESSIVE LUMBAR LAMINECTOMY LEVEL 2 (N/A) - Lumbar 3-4, lumbar 4-5 decompression. Clean class case.  Patient Location: PACU  Anesthesia Type:General  Level of Consciousness: awake, oriented and patient cooperative  Airway & Oxygen Therapy: Patient Spontanous Breathing and Patient connected to face mask oxygen  Post-op Assessment: Report given to PACU RN and Post -op Vital signs reviewed and stable  Post vital signs: Reviewed  Complications: No apparent anesthesia complications

## 2013-01-04 NOTE — Op Note (Signed)
NAME:  Walter Massey, Walter Massey NO.:  [id number]  MEDICAL RECORD NO.:  [id number]  LOCATION:  [facility]                         FACILITY:  [facility]  PHYSICIAN:  Estill Bamberg, MD      DATE OF BIRTH:  [birth year]  DATE OF PROCEDURE:  01/03/2013 DATE OF DISCHARGE:  01/03/2013                              OPERATIVE REPORT   PREOPERATIVE DIAGNOSIS:  L3-4, L4-5 spinal stenosis.  POSTOPERATIVE DIAGNOSIS:  L3-4, L4-5 spinal stenosis.  PROCEDURE:  L3-4, L4-5 laminectomy with bilateral partial facetectomy and bilateral foraminotomy with removal of posterolateral disk protrusions on the right hand and on the left at the L4-5 level.  SURGEON:  Estill Bamberg, MD  ASSISTANT:  Janace Litten, PA  ANESTHESIA:  General endotracheal anesthesia.  COMPLICATIONS:  None.  DISPOSITION:  Stable.  ESTIMATED BLOOD LOSS:  100 mL.  INDICATIONS FOR PROCEDURE:  Briefly, Walter Massey is very pleasant 67- year-old male, who did present to me on November 24, 2012, with ongoing severe pain in his bilateral buttocks and posterior thighs.  He states the pain has been present for approximately 6 months to 1 year.  The patient did have extensive forms of conservative care including therapy as well as epidural injections.  He did get temporary improvement with his injections, but his pain recurred after each one.  He did also note prominent weakness in his bilateral legs, and he did have weakness noted on his physical exam.  Given the patient's ongoing pain as well as subjective and objective weakness as well as the findings on his MRI, we did discuss proceeding with a 2 level decompression from L3-L5.  The patient did fully understand the risks and limitations of the procedure as outlined in my preoperative note.  OPERATIVE DETAILS:  On January 03, 2013, the patient was brought to surgery and general endotracheal anesthesia was administered.  The patient was placed prone on a well-padded flat Jackson bed  with spinal frame.  Antibiotics were given.  The back was prepped and draped in the usual sterile fashion.  Time-out procedure was performed.  I then made an incision overlying the midline from approximately the spinous process of L5 to approximately spinous process of L3.  The paraspinal musculature was incised at the midline with using electrocautery, the paraspinal musculature was bluntly swept laterally on the right and the left sides.  The lamina of L3, L4, and L5 were subperiosteally exposed. A lateral intraoperative radiograph did confirm the appropriate operative level.  I then removed the spinous processes of L3 and of L4. I then used a 4-mm high-speed bur to remove the central lamina.  I then used a series of Kerrison punches to additionally remove the central lamina as well as to perform a partial bilateral facetectomy.  Then, first on the right side, I did use a series of Kerrison punches to perform a bilateral partial facetectomy at the L3-4 and L4-5 levels. The neural foramina were evaluated using a Woodson, and the partial facetectomy was extended, in order to confirm complete decompression of the neural foramina.  A decompression was performed in a similar fashion on the left side and fully decompressing the lateral recess and the neural  foramina at the L3-4 and L4-5 levels.  I was very pleased with the decompression bilaterally at L3-4 and L4-5 including the central spinal canal, the lateral recesses, and the bilateral neuroforamen. However, of note, I did evaluate the L4-5 broad-based disk protrusion. It was noted to be somewhat prominent, and was noted to be causing compression of the bilateral traversing L5 nerves.  I did have an assistant hold medial retraction, first on the right side.  I then used a #15-blade knife to perform an oblique an annulotomy.  I then used a series of micro pituitaries to remove the posterolateral disk protrusion on the right.  This did  additionally decompress the lateral recess.  A diskectomy procedure was performed in a similar fashion on the left side.  Once again, this did additionally decompress the lateral recesses.  At this portion of the procedure, I did evaluate the traversing and exiting nerves, and was extremely pleased with the decompression.  The wound was then copiously irrigated.  I then controlled epidural bleeding using SURGIFLO in addition to bipolar electrocautery.  There was no epidural bleeding noted upon the termination of the procedure.  I then issues 40 mg of Depo-Medrol about the epidural space.  I then closed the fascia using #1 Vicryl.  The subcutaneous layer was then closed using 2-0 Vicryl.  The skin was closed using 3-0 Monocryl.  Benzoin and Steri-Strips were applied followed by sterile dressing.  All instrument counts were correct at the termination of the procedure.  Of note, Janace Litten was my assistant throughout the entirety of the procedure, and did aided in essential retraction and suctioning needed throughout the entire surgery.     Estill Bamberg, MD     MD/MEDQ  D:  01/03/2013  T:  01/04/2013  Job:  [id number]  cc:   Lupita Raider, M.D.

## 2013-01-05 ENCOUNTER — Encounter (HOSPITAL_COMMUNITY): Payer: Self-pay | Admitting: Orthopedic Surgery

## 2013-03-07 ENCOUNTER — Other Ambulatory Visit: Payer: Self-pay | Admitting: Gastroenterology

## 2013-10-11 ENCOUNTER — Other Ambulatory Visit: Payer: Self-pay | Admitting: Orthopedic Surgery

## 2013-10-29 ENCOUNTER — Encounter (HOSPITAL_COMMUNITY): Payer: Self-pay

## 2013-10-30 ENCOUNTER — Encounter (HOSPITAL_COMMUNITY): Payer: Self-pay

## 2013-10-30 ENCOUNTER — Encounter (HOSPITAL_COMMUNITY)
Admission: RE | Admit: 2013-10-30 | Discharge: 2013-10-30 | Disposition: A | Discharge status: Home / Self Care | Payer: BC Managed Care – PPO | Source: Ambulatory Visit | Attending: Orthopedic Surgery | Admitting: Orthopedic Surgery

## 2013-10-30 DIAGNOSIS — Z79899 Other long term (current) drug therapy: Secondary | ICD-10-CM | POA: Insufficient documentation

## 2013-10-30 DIAGNOSIS — Z01812 Encounter for preprocedural laboratory examination: Secondary | ICD-10-CM | POA: Insufficient documentation

## 2013-10-30 DIAGNOSIS — Z7982 Long term (current) use of aspirin: Secondary | ICD-10-CM | POA: Insufficient documentation

## 2013-10-30 DIAGNOSIS — M1610 Unilateral primary osteoarthritis, unspecified hip: Secondary | ICD-10-CM | POA: Insufficient documentation

## 2013-10-30 DIAGNOSIS — M169 Osteoarthritis of hip, unspecified: Secondary | ICD-10-CM | POA: Diagnosis not present

## 2013-10-30 HISTORY — DX: Unspecified osteoarthritis, unspecified site: M19.90

## 2013-10-30 LAB — CBC WITH DIFFERENTIAL/PLATELET
BASOS PCT: 0 % (ref 0–1)
Basophils Absolute: 0 10*3/uL (ref 0.0–0.1)
EOS ABS: 0.1 10*3/uL (ref 0.0–0.7)
Eosinophils Relative: 2 % (ref 0–5)
HCT: 43.4 % (ref 39.0–52.0)
Hemoglobin: 15.3 g/dL (ref 13.0–17.0)
LYMPHS ABS: 2.3 10*3/uL (ref 0.7–4.0)
Lymphocytes Relative: 34 % (ref 12–46)
MCH: 33.3 pg (ref 26.0–34.0)
MCHC: 35.3 g/dL (ref 30.0–36.0)
MCV: 94.6 fL (ref 78.0–100.0)
Monocytes Absolute: 0.4 10*3/uL (ref 0.1–1.0)
Monocytes Relative: 6 % (ref 3–12)
NEUTROS PCT: 58 % (ref 43–77)
Neutro Abs: 3.9 10*3/uL (ref 1.7–7.7)
PLATELETS: 238 10*3/uL (ref 150–400)
RBC: 4.59 MIL/uL (ref 4.22–5.81)
RDW: 11.7 % (ref 11.5–15.5)
WBC: 6.7 10*3/uL (ref 4.0–10.5)

## 2013-10-30 LAB — URINALYSIS, ROUTINE W REFLEX MICROSCOPIC
BILIRUBIN URINE: NEGATIVE
GLUCOSE, UA: NEGATIVE mg/dL
Hgb urine dipstick: NEGATIVE
KETONES UR: NEGATIVE mg/dL
Leukocytes, UA: NEGATIVE
Nitrite: NEGATIVE
PH: 5.5 (ref 5.0–8.0)
Protein, ur: NEGATIVE mg/dL
Specific Gravity, Urine: 1.031 — ABNORMAL HIGH (ref 1.005–1.030)
Urobilinogen, UA: 0.2 mg/dL (ref 0.0–1.0)

## 2013-10-30 LAB — BASIC METABOLIC PANEL
Anion gap: 11 (ref 5–15)
BUN: 17 mg/dL (ref 6–23)
CO2: 27 mEq/L (ref 19–32)
Calcium: 9.5 mg/dL (ref 8.4–10.5)
Chloride: 101 mEq/L (ref 96–112)
Creatinine, Ser: 0.93 mg/dL (ref 0.50–1.35)
GFR, EST NON AFRICAN AMERICAN: 85 mL/min — AB (ref >90)
Glucose, Bld: 107 mg/dL — ABNORMAL HIGH (ref 70–99)
Potassium: 4.7 mEq/L (ref 3.7–5.3)
Sodium: 139 mEq/L (ref 137–147)

## 2013-10-30 LAB — SURGICAL PCR SCREEN
MRSA, PCR: NEGATIVE
Staphylococcus aureus: NEGATIVE

## 2013-10-30 LAB — APTT: aPTT: 28 seconds (ref 24–37)

## 2013-10-30 LAB — PROTIME-INR
INR: 0.91 (ref 0.00–1.49)
PROTHROMBIN TIME: 12.3 s (ref 11.6–15.2)

## 2013-10-30 LAB — TYPE AND SCREEN
ABO/RH(D): O POS
Antibody Screen: NEGATIVE

## 2013-10-30 NOTE — Pre-Procedure Instructions (Addendum)
Walter Massey  10/30/2013   Your procedure is scheduled on:  Monday, October 5.  Report to [facility] Admitting at 8:00 a.m.   Call this number if you have problems the morning of surgery: [phone]   Remember:   Do not eat food or drink liquids after midnight Sunday, October 4.   Take these medicines the morning of surgery with A SIP OF WATER: omeprazole (PRILOSEC).               May take Tylenol if needed.              Stop taking Aspirin, etodolac (LODINE), Vitamins and Fish Oil.         Do not wear jewelry  Do not wear lotions, powders, or perfume.             Men may shave face and neck.  Do not bring valuables to the hospital.              [facility] is not responsible for any belongings or valuables.               Contacts, dentures or bridgework may not be worn into surgery.  Leave suitcase in the car. After surgery it may be brought to your room.  For patients admitted to the hospital, discharge time is determined by your  treatment team.                Special Instructions:Special Instructions: [facility] - Preparing for Surgery  Before surgery, you can play an important role.  Because skin is not sterile, your skin needs to be as free of germs as possible.  You can reduce the number of germs on you skin by washing with CHG (chlorahexidine gluconate) soap before surgery.  CHG is an antiseptic cleaner which kills germs and bonds with the skin to continue killing germs even after washing.  Please DO NOT use if you have an allergy to CHG or antibacterial soaps.  If your skin becomes reddened/irritated stop using the CHG and inform your nurse when you arrive at Short Stay.  Do not shave (including legs and underarms) for at least 48 hours prior to the first CHG shower.  You may shave your face.  Please follow these instructions carefully:   1.  Shower with CHG Soap the night before surgery and the  morning of Surgery.  2.  If you choose to wash your hair,  wash your hair first as usual with your  normal shampoo.  3.  After you shampoo, rinse your hair and body thoroughly to remove the  Shampoo.  4.  Use CHG as you would any other liquid soap.  You can apply chg directly to the skin and wash gently with scrungie or a clean washcloth.  5.  Apply the CHG Soap to your body ONLY FROM THE NECK DOWN.    Do not use on open wounds or open sores.  Avoid contact with your eyes, ears, mouth and genitals (private parts).  Wash genitals (private parts)   with your normal soap.  6.  Wash thoroughly, paying special attention to the area where your surgery will be performed.  7.  Thoroughly rinse your body with warm water from the neck down.  8.  DO NOT shower/wash with your normal soap after using and rinsing off   the CHG Soap.  9.  Pat yourself dry with a clean towel.  10.  Wear clean pajamas.            11.  Place clean sheets on your bed the night of your first shower and do not sleep with pets.  Day of Surgery  Do not apply any lotions/deodorants the morning of surgery.  Please wear clean clothes to the hospital/surgery center.   Please read over the following fact sheets that you were given: Pain Booklet, Coughing and Deep Breathing, Blood Transfusion Information and Surgical Site Infection Prevention And Incentive Spirometry.

## 2013-11-02 NOTE — H&P (Signed)
TOTAL HIP ADMISSION H&P  Patient is admitted for left total hip arthroplasty.  Subjective:  Chief Complaint: left hip pain  HPI: Walter Massey, 68 y.o. male, has a history of pain and functional disability in the left hip(s) due to arthritis and patient has failed non-surgical conservative treatments for greater than 12 weeks to include NSAID's and/or analgesics, corticosteriod injections, flexibility and strengthening excercises, weight reduction as appropriate and activity modification.  Onset of symptoms was gradual starting 5 years ago with gradually worsening course since that time.The patient noted prior procedures of the hip to include arthroplasty on the right hip(s).  Patient currently rates pain in the left hip at 10 out of 10 with activity. Patient has night pain, worsening of pain with activity and weight bearing, pain that interfers with activities of daily living and pain with passive range of motion. Patient has evidence of subchondral sclerosis, periarticular osteophytes and joint space narrowing by imaging studies. This condition presents safety issues increasing the risk of falls. There is no current active infection.  There are no active problems to display for this patient.  Past Medical History  Diagnosis Date  . GERD (gastroesophageal reflux disease)   . Hepatitis     HEP B- history of  . Arthritis     OA- "all over"    Past Surgical History  Procedure Laterality Date  . Joint replacement  2010    R hip  . Tonsillectomy    . Decompressive lumbar laminectomy level 2 N/A 01/03/2013    Procedure: DECOMPRESSIVE LUMBAR LAMINECTOMY LEVEL 2;  Surgeon: Sinclair Ship, MD;  Location: [place];  Service: Orthopedics;  Laterality: N/A;  Lumbar 3-4, lumbar 4-5 decompression. Clean class case.  . Back surgery  2014    No prescriptions prior to admission   No Known Allergies  History  Substance Use Topics  . Smoking status: Never Smoker   . Smokeless tobacco: Not on  file  . Alcohol Use: Yes     Comment: 1-2 beers per day     No family history on file.   Review of Systems  Constitutional: Negative.   HENT: Negative.   Eyes: Negative.   Respiratory: Negative.   Cardiovascular: Negative.   Gastrointestinal: Negative.   Genitourinary: Negative.   Musculoskeletal: Positive for back pain and joint pain.  Skin: Negative.   Neurological: Negative.   Endo/Heme/Allergies: Negative.   Psychiatric/Behavioral: Negative.     Objective:  Physical Exam  Constitutional: He is oriented to person, place, and time. He appears well-developed and well-nourished.  HENT:  Head: Normocephalic and atraumatic.  Eyes: Pupils are equal, round, and reactive to light.  Neck: Normal range of motion. Neck supple.  Cardiovascular: Intact distal pulses.   Respiratory: Effort normal.  Musculoskeletal: He exhibits tenderness.  Examination of the left hip reveals internal rotation to 20 with pain on the left.  Range of motion on the right is 40 of internal rotation without pain.  Negative straight leg raise test.  No bony tenderness in the lumbar spine.  Minimal pain at the SI joint on the left.  No lower extremity atrophy.  Neurological: He is alert and oriented to person, place, and time.  Skin: Skin is warm and dry.  Psychiatric: He has a normal mood and affect. His behavior is normal. Judgment and thought content normal.    Vital signs in last 24 hours:    Labs:   Estimated body mass index is 31.53 kg/(m^2) as calculated from the following:  Height as of 12/25/12: 5\' 11"  (1.803 m).   Weight as of 12/25/12: 102.513 kg (226 lb).   Imaging Review Plain radiographs demonstrate significant end-stage osteonecrosis of the left hip.  Assessment/Plan:  End stage arthritis, left hip(s)  The patient history, physical examination, clinical judgement of the provider and imaging studies are consistent with end stage degenerative joint disease of the left hip(s) and  total hip arthroplasty is deemed medically necessary. The treatment options including medical management, injection therapy, arthroscopy and arthroplasty were discussed at length. The risks and benefits of total hip arthroplasty were presented and reviewed. The risks due to aseptic loosening, infection, stiffness, dislocation/subluxation,  thromboembolic complications and other imponderables were discussed.  The patient acknowledged the explanation, agreed to proceed with the plan and consent was signed. Patient is being admitted for inpatient treatment for surgery, pain control, PT, OT, prophylactic antibiotics, VTE prophylaxis, progressive ambulation and ADL's and discharge planning.The patient is planning to be discharged home with home health services or possibly SNF placement.

## 2013-11-04 DIAGNOSIS — M1612 Unilateral primary osteoarthritis, left hip: Secondary | ICD-10-CM | POA: Diagnosis present

## 2013-11-04 MED ORDER — TRANEXAMIC ACID 100 MG/ML IV SOLN
1000.0000 mg | INTRAVENOUS | Status: AC
  Administered 2013-11-05: 1000 mg via INTRAVENOUS
  Filled 2013-11-04: qty 10

## 2013-11-04 MED ORDER — CEFAZOLIN SODIUM-DEXTROSE 2-3 GM-% IV SOLR
2.0000 g | INTRAVENOUS | Status: AC
  Administered 2013-11-05: 2 g via INTRAVENOUS
  Filled 2013-11-04: qty 50

## 2013-11-04 MED ORDER — CHLORHEXIDINE GLUCONATE 4 % EX LIQD
60.0000 mL | Freq: Once | CUTANEOUS | Status: DC
  Filled 2013-11-04: qty 60

## 2013-11-04 MED ORDER — DEXTROSE-NACL 5-0.45 % IV SOLN: INTRAVENOUS | Status: DC

## 2013-11-05 ENCOUNTER — Inpatient Hospital Stay (HOSPITAL_COMMUNITY): Payer: BC Managed Care – PPO

## 2013-11-05 ENCOUNTER — Encounter (HOSPITAL_COMMUNITY): Payer: BC Managed Care – PPO | Admitting: Anesthesiology

## 2013-11-05 ENCOUNTER — Encounter (HOSPITAL_COMMUNITY)
Admission: RE | Disposition: A | Discharge status: Home Health | Payer: Self-pay | Source: Ambulatory Visit | Attending: Orthopedic Surgery

## 2013-11-05 ENCOUNTER — Encounter (HOSPITAL_COMMUNITY): Payer: Self-pay | Admitting: Anesthesiology

## 2013-11-05 ENCOUNTER — Inpatient Hospital Stay (HOSPITAL_COMMUNITY): Payer: BC Managed Care – PPO | Admitting: Anesthesiology

## 2013-11-05 ENCOUNTER — Inpatient Hospital Stay (HOSPITAL_COMMUNITY)
Admission: RE | Admit: 2013-11-05 | Discharge: 2013-11-07 | DRG: 470 | Disposition: A | Discharge status: Home Health | Payer: BC Managed Care – PPO | Source: Ambulatory Visit | Attending: Orthopedic Surgery | Admitting: Orthopedic Surgery

## 2013-11-05 DIAGNOSIS — Z79899 Other long term (current) drug therapy: Secondary | ICD-10-CM | POA: Diagnosis not present

## 2013-11-05 DIAGNOSIS — Z7982 Long term (current) use of aspirin: Secondary | ICD-10-CM

## 2013-11-05 DIAGNOSIS — D62 Acute posthemorrhagic anemia: Secondary | ICD-10-CM | POA: Diagnosis not present

## 2013-11-05 DIAGNOSIS — K219 Gastro-esophageal reflux disease without esophagitis: Secondary | ICD-10-CM | POA: Diagnosis present

## 2013-11-05 DIAGNOSIS — M1612 Unilateral primary osteoarthritis, left hip: Secondary | ICD-10-CM | POA: Diagnosis present

## 2013-11-05 HISTORY — PX: TOTAL HIP ARTHROPLASTY: SHX124

## 2013-11-05 SURGERY — ARTHROPLASTY, HIP, TOTAL,POSTERIOR APPROACH
Anesthesia: General | Site: Hip | Laterality: Left

## 2013-11-05 MED ORDER — PROMETHAZINE HCL 25 MG/ML IJ SOLN: 6.2500 mg | INTRAMUSCULAR | Status: DC | PRN

## 2013-11-05 MED ORDER — LIDOCAINE HCL (CARDIAC) 20 MG/ML IV SOLN
INTRAVENOUS | Status: DC | PRN
  Administered 2013-11-05: 80 mg via INTRAVENOUS

## 2013-11-05 MED ORDER — ASPIRIN EC 325 MG PO TBEC: 325.0000 mg | DELAYED_RELEASE_TABLET | Freq: Two times a day (BID) | ORAL | Status: DC

## 2013-11-05 MED ORDER — BUPIVACAINE-EPINEPHRINE 0.5% -1:200000 IJ SOLN
INTRAMUSCULAR | Status: DC | PRN
  Administered 2013-11-05: 20 mL

## 2013-11-05 MED ORDER — NEOSTIGMINE METHYLSULFATE 10 MG/10ML IV SOLN
INTRAVENOUS | Status: AC
  Filled 2013-11-05: qty 1

## 2013-11-05 MED ORDER — SENNOSIDES-DOCUSATE SODIUM 8.6-50 MG PO TABS
1.0000 | ORAL_TABLET | Freq: Every evening | ORAL | Status: DC | PRN
  Administered 2013-11-06 (×2): 1 via ORAL
  Filled 2013-11-05 (×2): qty 1

## 2013-11-05 MED ORDER — DOCUSATE SODIUM 100 MG PO CAPS
100.0000 mg | ORAL_CAPSULE | Freq: Two times a day (BID) | ORAL | Status: DC
  Administered 2013-11-05 – 2013-11-07 (×4): 100 mg via ORAL
  Filled 2013-11-05 (×5): qty 1

## 2013-11-05 MED ORDER — PHENOL 1.4 % MT LIQD: 1.0000 | OROMUCOSAL | Status: DC | PRN

## 2013-11-05 MED ORDER — MAGNESIUM CITRATE PO SOLN: 1.0000 | Freq: Once | ORAL | Status: AC | PRN

## 2013-11-05 MED ORDER — ONDANSETRON HCL 4 MG/2ML IJ SOLN: 4.0000 mg | Freq: Four times a day (QID) | INTRAMUSCULAR | Status: DC | PRN

## 2013-11-05 MED ORDER — HYDROMORPHONE HCL 1 MG/ML IJ SOLN
INTRAMUSCULAR | Status: AC
  Filled 2013-11-05: qty 1

## 2013-11-05 MED ORDER — METOCLOPRAMIDE HCL 10 MG PO TABS
5.0000 mg | ORAL_TABLET | Freq: Three times a day (TID) | ORAL | Status: DC | PRN
  Administered 2013-11-06 – 2013-11-07 (×2): 10 mg via ORAL
  Filled 2013-11-05 (×2): qty 1

## 2013-11-05 MED ORDER — OXYCODONE-ACETAMINOPHEN 5-325 MG PO TABS: 1.0000 | ORAL_TABLET | ORAL | Status: AC | PRN

## 2013-11-05 MED ORDER — KCL IN DEXTROSE-NACL 20-5-0.45 MEQ/L-%-% IV SOLN
INTRAVENOUS | Status: DC
  Administered 2013-11-05: 16:00:00 via INTRAVENOUS
  Filled 2013-11-05 (×9): qty 1000

## 2013-11-05 MED ORDER — ONDANSETRON HCL 4 MG/2ML IJ SOLN
INTRAMUSCULAR | Status: DC | PRN
  Administered 2013-11-05: 4 mg via INTRAVENOUS

## 2013-11-05 MED ORDER — GLYCOPYRROLATE 0.2 MG/ML IJ SOLN
INTRAMUSCULAR | Status: DC | PRN
  Administered 2013-11-05: 0.2 mg via INTRAVENOUS
  Administered 2013-11-05: 0.6 mg via INTRAVENOUS

## 2013-11-05 MED ORDER — HYDROMORPHONE HCL 1 MG/ML IJ SOLN
0.5000 mg | INTRAMUSCULAR | Status: DC | PRN
  Administered 2013-11-05: 0.5 mg via INTRAVENOUS

## 2013-11-05 MED ORDER — ARTIFICIAL TEARS OP OINT
TOPICAL_OINTMENT | OPHTHALMIC | Status: AC
  Filled 2013-11-05: qty 3.5

## 2013-11-05 MED ORDER — MIDAZOLAM HCL 5 MG/5ML IJ SOLN
INTRAMUSCULAR | Status: DC | PRN
Start: 2013-11-05 — End: 2013-11-05
  Administered 2013-11-05 (×2): 1 mg via INTRAVENOUS

## 2013-11-05 MED ORDER — LACTATED RINGERS IV SOLN
INTRAVENOUS | Status: DC
  Administered 2013-11-05: 50 mL/h via INTRAVENOUS

## 2013-11-05 MED ORDER — HYDROMORPHONE HCL 1 MG/ML IJ SOLN: 0.2500 mg | INTRAMUSCULAR | Status: DC | PRN

## 2013-11-05 MED ORDER — DEXAMETHASONE SODIUM PHOSPHATE 4 MG/ML IJ SOLN
INTRAMUSCULAR | Status: DC | PRN
  Administered 2013-11-05: 4 mg via INTRAVENOUS

## 2013-11-05 MED ORDER — ASPIRIN EC 325 MG PO TBEC
325.0000 mg | DELAYED_RELEASE_TABLET | Freq: Every day | ORAL | Status: DC
  Administered 2013-11-06 – 2013-11-07 (×2): 325 mg via ORAL
  Filled 2013-11-05 (×3): qty 1

## 2013-11-05 MED ORDER — HYDROMORPHONE HCL 1 MG/ML IJ SOLN
INTRAMUSCULAR | Status: AC
  Administered 2013-11-05: 0.5 mg via INTRAVENOUS
  Filled 2013-11-05: qty 1

## 2013-11-05 MED ORDER — OXYCODONE HCL 5 MG PO TABS
5.0000 mg | ORAL_TABLET | ORAL | Status: DC | PRN
  Administered 2013-11-05 – 2013-11-07 (×9): 10 mg via ORAL
  Filled 2013-11-05 (×8): qty 2

## 2013-11-05 MED ORDER — METOCLOPRAMIDE HCL 5 MG/ML IJ SOLN: 5.0000 mg | Freq: Three times a day (TID) | INTRAMUSCULAR | Status: DC | PRN

## 2013-11-05 MED ORDER — PANTOPRAZOLE SODIUM 40 MG PO TBEC
40.0000 mg | DELAYED_RELEASE_TABLET | Freq: Every day | ORAL | Status: DC
  Administered 2013-11-05 – 2013-11-07 (×3): 40 mg via ORAL
  Filled 2013-11-05 (×3): qty 1

## 2013-11-05 MED ORDER — OMEGA-3-ACID ETHYL ESTERS 1 G PO CAPS
2.0000 g | ORAL_CAPSULE | Freq: Every day | ORAL | Status: DC
  Administered 2013-11-05 – 2013-11-07 (×3): 2 g via ORAL
  Filled 2013-11-05 (×3): qty 2

## 2013-11-05 MED ORDER — OXYCODONE HCL 5 MG PO TABS
ORAL_TABLET | ORAL | Status: AC
  Filled 2013-11-05: qty 2

## 2013-11-05 MED ORDER — FENTANYL CITRATE 0.05 MG/ML IJ SOLN
INTRAMUSCULAR | Status: AC
  Filled 2013-11-05: qty 5

## 2013-11-05 MED ORDER — ONDANSETRON HCL 4 MG PO TABS: 4.0000 mg | ORAL_TABLET | Freq: Four times a day (QID) | ORAL | Status: DC | PRN

## 2013-11-05 MED ORDER — FENTANYL CITRATE 0.05 MG/ML IJ SOLN
25.0000 ug | INTRAMUSCULAR | Status: DC | PRN
  Administered 2013-11-05 (×3): 25 ug via INTRAVENOUS

## 2013-11-05 MED ORDER — LACTATED RINGERS IV SOLN
INTRAVENOUS | Status: DC | PRN
  Administered 2013-11-05 (×2): via INTRAVENOUS

## 2013-11-05 MED ORDER — HYDROMORPHONE HCL 1 MG/ML IJ SOLN
1.0000 mg | INTRAMUSCULAR | Status: DC | PRN
  Administered 2013-11-05 – 2013-11-06 (×2): 1 mg via INTRAVENOUS
  Filled 2013-11-05 (×2): qty 1

## 2013-11-05 MED ORDER — METHOCARBAMOL 1000 MG/10ML IJ SOLN
500.0000 mg | Freq: Four times a day (QID) | INTRAVENOUS | Status: DC | PRN
  Administered 2013-11-05: 500 mg via INTRAVENOUS
  Filled 2013-11-05: qty 5

## 2013-11-05 MED ORDER — BUPIVACAINE-EPINEPHRINE (PF) 0.5% -1:200000 IJ SOLN
INTRAMUSCULAR | Status: AC
  Filled 2013-11-05: qty 30

## 2013-11-05 MED ORDER — MIDAZOLAM HCL 2 MG/2ML IJ SOLN
INTRAMUSCULAR | Status: AC
  Filled 2013-11-05: qty 2

## 2013-11-05 MED ORDER — DEXAMETHASONE SODIUM PHOSPHATE 4 MG/ML IJ SOLN
INTRAMUSCULAR | Status: AC
  Filled 2013-11-05: qty 1

## 2013-11-05 MED ORDER — PHENYLEPHRINE HCL 10 MG/ML IJ SOLN
INTRAMUSCULAR | Status: DC | PRN
  Administered 2013-11-05: 40 ug via INTRAVENOUS

## 2013-11-05 MED ORDER — BISACODYL 5 MG PO TBEC
5.0000 mg | DELAYED_RELEASE_TABLET | Freq: Every day | ORAL | Status: DC | PRN
  Filled 2013-11-05: qty 1

## 2013-11-05 MED ORDER — ROCURONIUM BROMIDE 50 MG/5ML IV SOLN
INTRAVENOUS | Status: AC
Start: 2013-11-05 — End: 2013-11-05
  Filled 2013-11-05: qty 1

## 2013-11-05 MED ORDER — ACETAMINOPHEN 325 MG PO TABS
650.0000 mg | ORAL_TABLET | Freq: Four times a day (QID) | ORAL | Status: DC | PRN
  Administered 2013-11-06: 650 mg via ORAL
  Filled 2013-11-05: qty 2

## 2013-11-05 MED ORDER — NEOSTIGMINE METHYLSULFATE 10 MG/10ML IV SOLN
INTRAVENOUS | Status: DC | PRN
  Administered 2013-11-05: 4 mg via INTRAVENOUS

## 2013-11-05 MED ORDER — ROCURONIUM BROMIDE 100 MG/10ML IV SOLN
INTRAVENOUS | Status: DC | PRN
  Administered 2013-11-05: 10 mg via INTRAVENOUS
  Administered 2013-11-05: 50 mg via INTRAVENOUS

## 2013-11-05 MED ORDER — PROPOFOL 10 MG/ML IV BOLUS
INTRAVENOUS | Status: DC | PRN
  Administered 2013-11-05: 160 mg via INTRAVENOUS

## 2013-11-05 MED ORDER — DIPHENHYDRAMINE HCL 12.5 MG/5ML PO ELIX: 12.5000 mg | ORAL_SOLUTION | ORAL | Status: DC | PRN

## 2013-11-05 MED ORDER — ACETAMINOPHEN 650 MG RE SUPP: 650.0000 mg | Freq: Four times a day (QID) | RECTAL | Status: DC | PRN

## 2013-11-05 MED ORDER — MENTHOL 3 MG MT LOZG: 1.0000 | LOZENGE | OROMUCOSAL | Status: DC | PRN

## 2013-11-05 MED ORDER — FENTANYL CITRATE 0.05 MG/ML IJ SOLN
INTRAMUSCULAR | Status: AC
  Administered 2013-11-05: 25 ug via INTRAVENOUS
  Filled 2013-11-05: qty 2

## 2013-11-05 MED ORDER — PROPOFOL 10 MG/ML IV BOLUS
INTRAVENOUS | Status: AC
  Filled 2013-11-05: qty 20

## 2013-11-05 MED ORDER — FENTANYL CITRATE 0.05 MG/ML IJ SOLN
INTRAMUSCULAR | Status: DC | PRN
  Administered 2013-11-05 (×5): 50 ug via INTRAVENOUS

## 2013-11-05 MED ORDER — HYDROMORPHONE HCL 1 MG/ML IJ SOLN
0.2500 mg | INTRAMUSCULAR | Status: DC | PRN
  Administered 2013-11-05 (×5): 0.5 mg via INTRAVENOUS

## 2013-11-05 MED ORDER — ONDANSETRON HCL 4 MG/2ML IJ SOLN
INTRAMUSCULAR | Status: AC
  Filled 2013-11-05: qty 2

## 2013-11-05 MED ORDER — METHOCARBAMOL 500 MG PO TABS: 500.0000 mg | ORAL_TABLET | Freq: Two times a day (BID) | ORAL | Status: AC

## 2013-11-05 MED ORDER — DIPHENHYDRAMINE HCL 50 MG/ML IJ SOLN
INTRAMUSCULAR | Status: AC
  Filled 2013-11-05: qty 1

## 2013-11-05 MED ORDER — GLYCOPYRROLATE 0.2 MG/ML IJ SOLN
INTRAMUSCULAR | Status: AC
  Filled 2013-11-05: qty 2

## 2013-11-05 MED ORDER — METHOCARBAMOL 500 MG PO TABS
500.0000 mg | ORAL_TABLET | Freq: Four times a day (QID) | ORAL | Status: DC | PRN
  Administered 2013-11-05 – 2013-11-07 (×4): 500 mg via ORAL
  Filled 2013-11-05 (×5): qty 1

## 2013-11-05 SURGICAL SUPPLY — 51 items
BLADE SAW SGTL 18X1.27X75 (BLADE) ×2 IMPLANT
BLADE SAW SGTL 18X1.27X75MM (BLADE) ×1
CAPT HIP PF COP ×3 IMPLANT
COVER SURGICAL LIGHT HANDLE (MISCELLANEOUS) ×6 IMPLANT
DRAPE ORTHO SPLIT 77X108 STRL (DRAPES) ×2
DRAPE PROXIMA HALF (DRAPES) ×3 IMPLANT
DRAPE SURG ORHT 6 SPLT 77X108 (DRAPES) ×1 IMPLANT
DRAPE U-SHAPE 47X51 STRL (DRAPES) ×3 IMPLANT
DRILL BIT 7/64X5 (BIT) ×3 IMPLANT
DRSG AQUACEL AG ADV 3.5X10 (GAUZE/BANDAGES/DRESSINGS) ×3 IMPLANT
DURAPREP 26ML APPLICATOR (WOUND CARE) ×3 IMPLANT
ELECT BLADE 4.0 EZ CLEAN MEGAD (MISCELLANEOUS)
ELECT REM PT RETURN 9FT ADLT (ELECTROSURGICAL) ×3
ELECTRODE BLDE 4.0 EZ CLN MEGD (MISCELLANEOUS) IMPLANT
ELECTRODE REM PT RTRN 9FT ADLT (ELECTROSURGICAL) ×1 IMPLANT
GAUZE XEROFORM 1X8 LF (GAUZE/BANDAGES/DRESSINGS) ×3 IMPLANT
GLOVE BIO SURGEON STRL SZ7.5 (GLOVE) ×3 IMPLANT
GLOVE BIO SURGEON STRL SZ8.5 (GLOVE) ×6 IMPLANT
GLOVE BIOGEL M 7.0 STRL (GLOVE) ×3 IMPLANT
GLOVE BIOGEL PI IND STRL 7.0 (GLOVE) ×1 IMPLANT
GLOVE BIOGEL PI IND STRL 8 (GLOVE) ×3 IMPLANT
GLOVE BIOGEL PI IND STRL 8.5 (GLOVE) ×1 IMPLANT
GLOVE BIOGEL PI IND STRL 9 (GLOVE) ×1 IMPLANT
GLOVE BIOGEL PI INDICATOR 7.0 (GLOVE) ×2
GLOVE BIOGEL PI INDICATOR 8 (GLOVE) ×6
GLOVE BIOGEL PI INDICATOR 8.5 (GLOVE) ×2
GLOVE BIOGEL PI INDICATOR 9 (GLOVE) ×2
GOWN STRL REUS W/ TWL LRG LVL3 (GOWN DISPOSABLE) ×2 IMPLANT
GOWN STRL REUS W/ TWL XL LVL3 (GOWN DISPOSABLE) ×3 IMPLANT
GOWN STRL REUS W/TWL 2XL LVL3 (GOWN DISPOSABLE) ×3 IMPLANT
GOWN STRL REUS W/TWL LRG LVL3 (GOWN DISPOSABLE) ×4
GOWN STRL REUS W/TWL XL LVL3 (GOWN DISPOSABLE) ×6
HOOD PEEL AWAY FACE SHEILD DIS (HOOD) ×6 IMPLANT
KIT BASIN OR (CUSTOM PROCEDURE TRAY) ×3 IMPLANT
KIT ROOM TURNOVER OR (KITS) ×3 IMPLANT
MANIFOLD NEPTUNE II (INSTRUMENTS) ×3 IMPLANT
NEEDLE 22X1 1/2 (OR ONLY) (NEEDLE) ×3 IMPLANT
NS IRRIG 1000ML POUR BTL (IV SOLUTION) ×3 IMPLANT
PACK TOTAL JOINT (CUSTOM PROCEDURE TRAY) ×3 IMPLANT
PAD ARMBOARD 7.5X6 YLW CONV (MISCELLANEOUS) ×6 IMPLANT
PASSER SUT SWANSON 36MM LOOP (INSTRUMENTS) ×3 IMPLANT
SUT ETHIBOND 2 V 37 (SUTURE) ×3 IMPLANT
SUT VIC AB 0 CTB1 27 (SUTURE) ×3 IMPLANT
SUT VIC AB 1 CTX 36 (SUTURE) ×2
SUT VIC AB 1 CTX36XBRD ANBCTR (SUTURE) ×1 IMPLANT
SUT VIC AB 2-0 CTB1 (SUTURE) ×3 IMPLANT
SUT VIC AB 3-0 SH 27 (SUTURE) ×2
SUT VIC AB 3-0 SH 27X BRD (SUTURE) ×1 IMPLANT
SYR CONTROL 10ML LL (SYRINGE) ×3 IMPLANT
TOWEL OR 17X24 6PK STRL BLUE (TOWEL DISPOSABLE) ×3 IMPLANT
TOWEL OR 17X26 10 PK STRL BLUE (TOWEL DISPOSABLE) ×3 IMPLANT

## 2013-11-05 NOTE — Op Note (Signed)
OPERATIVE REPORT    DATE OF PROCEDURE:  11/05/2013       PREOPERATIVE DIAGNOSIS:  LEFT HIP DEGENERATUIVE JOINT DISEASE                                                          POSTOPERATIVE DIAGNOSIS:  LEFT HIP DEGENERATUIVE JOINT DISEASE                                                           PROCEDURE:  L total hip arthroplasty using a 54 mm DePuy Pinnacle  Cup, [employer], 10-degree polyethylene liner index superior  and posterior, a +0 36 mm ceramic head, a 18x13x150x42 SROM stem, 18FXXL Sleeve   SURGEON: Sherline Eberwein J    ASSISTANT:   Eric K. Barton Dubois  (present throughout entire procedure and necessary for timely completion of the procedure)   ANESTHESIA: General BLOOD LOSS: 300 FLUID REPLACEMENT: 1800 crystalloid DRAINS: Foley Catheter URINE OUTPUT: 416SA COMPLICATIONS: none    INDICATIONS FOR PROCEDURE: A 68 y.o. year-old With  [facility]   for 5 years, x-rays show bone-on-bone arthritic changes. Despite conservative measures with observation, anti-inflammatory medicine, narcotics, use of a cane, has severe unremitting pain and can ambulate only a few blocks before resting.  Patient desires elective L total hip arthroplasty to decrease pain and increase function. The risks, benefits, and alternatives were discussed at length including but not limited to the risks of infection, bleeding, nerve injury, stiffness, blood clots, the need for revision surgery, cardiopulmonary complications, among others, and they were willing to proceed. Questions answered     PROCEDURE IN DETAIL: The patient was identified by armband,  received preoperative IV antibiotics in the holding area at [facility], taken to the operating room , appropriate anesthetic monitors  were attached and general endotracheal anesthesia induced. Foley catheter was inserted. Pt was rolled into the R lateral decubitus position and fixed there with a Stulberg Mark II  pelvic clamp.  The L lower extremity was then prepped and draped  in the usual sterile fashion from the ankle to the hemipelvis. A time-out  procedure was performed. The skin along the lateral hip and thigh  infiltrated with 10 mL of 0.5% Marcaine and epinephrine solution. We  then made a posterolateral approach to the hip. With a #10 blade, a 15 cm  incision was made through the skin and subcutaneous tissue down to the level of the  IT band. Small bleeders were identified and cauterized. The IT band was cut in  line with skin incision exposing the greater trochanter. A Cobra retractor was placed between the gluteus minimus and the superior hip joint capsule, and a spiked Cobra between the quadratus femoris and the inferior hip joint capsule. This isolated the short  external rotators and piriformis tendons. These were tagged with a #2 Ethibond  suture and cut off their insertion on the intertrochanteric crest. The posterior  capsule was then developed into an acetabular-based flap from Posterior Superior off of the acetabulum out over the femoral neck and back posterior inferior to the acetabular rim. This flap  was tagged with two #2 Ethibond sutures and retracted protecting the sciatic nerve. This exposed the arthritic femoral head and osteophytes. The hip was then flexed and internally rotated, dislocating the femoral head and a standard neck cut performed 1 fingerbreadth above the lesser trochanter.  A spiked Cobra was placed in the cotyloid notch and a Hohmann retractor was then used to lever the femur anteriorly off of the anterior pelvic column. A posterior-inferior wing retractor was placed at the junction of the acetabulum and the ischium completing the acetabular exposure.We then removed the peripheral osteophytes and labrum from the acetabulum. We then reamed the acetabulum up to 53 mm with basket reamers obtaining good coverage in all quadrants. We then irrigated with normal  saline solution  and hammered into place a 54 mm pinnacle cup in 45  degrees of abduction and about 20 degrees of anteversion. More  peripheral osteophytes removed and a trial 10-degree liner placed with the  index superior-posterior. The hip was then flexed and internally rotated exposing the  proximal femur, which was entered with the initiating reamer followed by  the axial reamers up to a 13.5 mm full depth and 69mm partial depth. We then conically reamed to 22F to the correct depth for a 42 base neck. The calcar was milled to 66fXXL. A trial cone and stem was inserted in the 25 degrees anteversion, with a +0 11mm trial head. Trial reduction was then performed and excellent stability was noted with at 90 of flexion with 75 of internal rotation and then full extension with maximal external rotation. The hip could not be dislocated in full extension. The knee could easily flex  to about 130 degrees. We also stretched the abductors at this point,  because of the preexisting adductor contractures. All trial components  were then removed. The acetabulum was irrigated out with normal saline  solution. A titanium Apex [facility] was then screwed into place  followed by a 10-degree polyethylene liner index superior-posterior. On  the femoral side a 22FXXL ZTT1 sleeve was hammered into place, followed by a 18x13x150x42 SROM stem in 25 degrees of anteversion. At this point, a +0 36 mm ceramic head was  hammered on the stem. The hip was reduced. We checked our stability  one more time and found it to be excellent. The wound was once again  thoroughly irrigated out with normal saline solution pulse lavage. The  capsular flap and short external rotators were repaired back to the  intertrochanteric crest through drill holes with a #2 Ethibond suture.  The IT band was closed with running 1 Vicryl suture. The subcutaneous  tissue with 0 and 2-0 undyed Vicryl suture and the skin with running  interlocking 3-0 nylon  suture. Dressing of Xeroform and Mepilex was  then applied. The patient was then unclamped, rolled supine, awaken extubated and taken to recovery room without difficulty in stable condition.   Arsal Tappan J 11/05/2013, 10:51 AM

## 2013-11-05 NOTE — Transfer of Care (Signed)
Immediate Anesthesia Transfer of Care Note  Patient: Walter Massey  Procedure(s) Performed: Procedure(s): LEFT TOTAL HIP ARTHROPLASTY (Left)  Patient Location: PACU  Anesthesia Type:General  Level of Consciousness: awake, alert  and oriented  Airway & Oxygen Therapy: Patient Spontanous Breathing and Patient connected to face mask oxygen  Post-op Assessment: Report given to PACU RN  Post vital signs: Reviewed and stable  Complications: No apparent anesthesia complications

## 2013-11-05 NOTE — Progress Notes (Signed)
Utilization review completed.  

## 2013-11-05 NOTE — Anesthesia Postprocedure Evaluation (Signed)
  Anesthesia Post-op Note  Patient: Walter Massey  Procedure(s) Performed: Procedure(s) (LRB): LEFT TOTAL HIP ARTHROPLASTY (Left)  Patient Location: PACU  Anesthesia Type: General  Level of Consciousness: awake and alert   Airway and Oxygen Therapy: Patient Spontanous Breathing  Post-op Pain: mild  Post-op Assessment: Post-op Vital signs reviewed, Patient's Cardiovascular Status Stable, Respiratory Function Stable, Patent Airway and No signs of Nausea or vomiting  Last Vitals:  Filed Vitals:   11/05/13 1315  BP:   Pulse: 76  Temp: 36.7 C  Resp: 16    Post-op Vital Signs: stable   Complications: No apparent anesthesia complications. Required increased levels of narcotics in PACU.

## 2013-11-05 NOTE — Progress Notes (Signed)
Orthopedic Tech Progress Note Patient Details:  Walter Massey [birth year] [id number]  Ortho Devices Ortho Device/Splint Location: trapeze bar patient helper Ortho Device/Splint Interventions: Application   Hildred Priest 11/05/2013, 8:19 PM

## 2013-11-05 NOTE — Progress Notes (Signed)
Orthopedic Tech Progress Note Patient Details:  MICHALL NOFFKE 1945-07-10 [id number]  Patient ID: Leland Her, male   DOB: November 08, 1945, 68 y.o.   MRN: [id number] Viewed order from doctor's order list  Hildred Priest 11/05/2013, 8:19 PM

## 2013-11-05 NOTE — Progress Notes (Signed)
I had a conversation with patient about his pain level. I explained to him we need to get him to a resting pain and it looked like he was resting. Patient states he hasn't been able to rest comfortably in many years. Therefore, we had a discussion about how far from his level of functioning at home and being comfortable was he, and he stated he was almost there. We discussed we had two more doses on pain medication and we would revisit his pain level. Patient seems to be resting comfortably by the faces scale.

## 2013-11-05 NOTE — Anesthesia Preprocedure Evaluation (Signed)
Anesthesia Evaluation  Patient identified by MRN, date of birth, ID band Patient awake    Reviewed: Allergy & Precautions, H&P , NPO status , Patient's Chart, lab work & pertinent test results  Airway Mallampati: II TM Distance: >3 FB Neck ROM: Full    Dental no notable dental hx.    Pulmonary neg pulmonary ROS,  breath sounds clear to auscultation  Pulmonary exam normal       Cardiovascular negative cardio ROS  Rhythm:Regular Rate:Normal     Neuro/Psych negative neurological ROS  negative psych ROS   GI/Hepatic GERD-  Medicated,(+) Hepatitis -  Endo/Other  negative endocrine ROS  Renal/GU negative Renal ROS  negative genitourinary   Musculoskeletal  (+) Arthritis -,   Abdominal   Peds negative pediatric ROS (+)  Hematology negative hematology ROS (+)   Anesthesia Other Findings   Reproductive/Obstetrics negative OB ROS                           Anesthesia Physical Anesthesia Plan  ASA: II  Anesthesia Plan: General   Post-op Pain Management:    Induction: Intravenous  Airway Management Planned: Oral ETT  Additional Equipment:   Intra-op Plan:   Post-operative Plan: Extubation in OR  Informed Consent: I have reviewed the patients History and Physical, chart, labs and discussed the procedure including the risks, benefits and alternatives for the proposed anesthesia with the patient or authorized representative who has indicated his/her understanding and acceptance.   Dental advisory given  Plan Discussed with: CRNA  Anesthesia Plan Comments:         Anesthesia Quick Evaluation

## 2013-11-05 NOTE — Interval H&P Note (Signed)
History and Physical Interval Note:  11/05/2013 9:00 AM  Walter Massey  has presented today for surgery, with the diagnosis of LEFT HIP [place]  The various methods of treatment have been discussed with the patient and family. After consideration of risks, benefits and other options for treatment, the patient has consented to  Procedure(s): LEFT TOTAL HIP ARTHROPLASTY (Left) as a surgical intervention .  The patient's history has been reviewed, patient examined, no change in status, stable for surgery.  I have reviewed the patient's chart and labs.  Questions were answered to the patient's satisfaction.     Kerin Salen

## 2013-11-06 ENCOUNTER — Encounter (HOSPITAL_COMMUNITY): Payer: Self-pay | Admitting: Orthopedic Surgery

## 2013-11-06 LAB — CBC
HEMATOCRIT: 43.7 % (ref 39.0–52.0)
HEMOGLOBIN: 15.1 g/dL (ref 13.0–17.0)
MCH: 33.4 pg (ref 26.0–34.0)
MCHC: 34.6 g/dL (ref 30.0–36.0)
MCV: 96.7 fL (ref 78.0–100.0)
Platelets: 253 10*3/uL (ref 150–400)
RBC: 4.52 MIL/uL (ref 4.22–5.81)
RDW: 12 % (ref 11.5–15.5)
WBC: 14.5 10*3/uL — AB (ref 4.0–10.5)

## 2013-11-06 LAB — BASIC METABOLIC PANEL
Anion gap: 16 — ABNORMAL HIGH (ref 5–15)
BUN: 11 mg/dL (ref 6–23)
CHLORIDE: 98 meq/L (ref 96–112)
CO2: 28 mEq/L (ref 19–32)
Calcium: 9.3 mg/dL (ref 8.4–10.5)
Creatinine, Ser: 1.04 mg/dL (ref 0.50–1.35)
GFR calc non Af Amer: 72 mL/min — ABNORMAL LOW (ref >90)
GFR, EST AFRICAN AMERICAN: 84 mL/min — AB (ref >90)
GLUCOSE: 139 mg/dL — AB (ref 70–99)
POTASSIUM: 4.1 meq/L (ref 3.7–5.3)
Sodium: 142 mEq/L (ref 137–147)

## 2013-11-06 NOTE — Progress Notes (Signed)
Physical Therapy Treatment Patient Details Name: Walter Massey MRN: [id number] DOB: [birth year] Today's Date: 11/06/2013    History of Present Illness s/p L THA. h/o of L THA x 5 years ago and back sx Dec 2014    PT Comments    Pt demonstrating improved endurance with gait. Focus on normalizing gait pattern during gait training (step through vs. Step to pattern). Pain increased this PM in L hip. Pt will benefit from further training with bed mobility as this continues to be the most difficult as well as sit to stands. Plan remains for pt to d/c home with intermittent S and HHPT follow up.   Follow Up Recommendations  Home health PT;Supervision - Intermittent     Equipment Recommendations  None recommended by PT (pt already owns RW)    Recommendations for Other Services       Precautions / Restrictions Precautions Precautions: Posterior Hip Precaution Booklet Issued: Yes (comment) Precaution Comments: Able to recall precautions Restrictions Weight Bearing Restrictions: Yes LLE Weight Bearing: Weight bearing as tolerated    Mobility  Bed Mobility Overal bed mobility: Needs Assistance Bed Mobility: Supine to Sit     Supine to sit: Min assist;HOB elevated     General bed mobility comments: pt with increased difficulty with bed mobility this PM requiring increased A and use of the rails/trapeeze  Transfers Overall transfer level: Needs assistance Equipment used: Rolling walker (2 wheeled) Transfers: Sit to/from Stand Sit to Stand: Min assist;From elevated surface         General transfer comment: cues for hand placement and technique  Ambulation/Gait Ambulation/Gait assistance: Supervision Ambulation Distance (Feet): 200 Feet (x2) Assistive device: Rolling walker (2 wheeled) Gait Pattern/deviations: Step-to pattern;Decreased stance time - left;Decreased stride length;Antalgic     General Gait Details: no LOB. improved endurance noted. cues for more normalized  gait pattern for step through vs step to gait pattern. encouraged pt to push the RW as he was walking   Financial trader Rankin (Stroke Patients Only)       Balance                                    Cognition Arousal/Alertness: Awake/alert Behavior During Therapy: WFL for tasks assessed/performed Overall Cognitive Status: Within Functional Limits for tasks assessed                      Exercises Total Joint Exercises Ankle Circles/Pumps: AROM;Both;15 reps Quad Sets: Left;Strengthening;10 reps Short Arc Quad: Strengthening;Left;10 reps Heel Slides: AAROM;Strengthening;Left;10 reps Hip ABduction/ADduction: AAROM;Strengthening;Left;10 reps Long Arc Quad: AROM;Strengthening;Left;10 reps    General Comments        Pertinent Vitals/Pain Pain Score:  ("it hurts") Pain Location: L hip Pain Descriptors / Indicators: Aching;Sore Pain Intervention(s): Monitored during session;Patient requesting pain meds-RN notified    Home Living                      Prior Function            PT Goals (current goals can now be found in the care plan section) Acute Rehab PT Goals Patient Stated Goal: go home tomorrow PT Goal Formulation: With patient Time For Goal Achievement: 11/13/13 Potential to Achieve Goals: Good Progress towards PT goals: Progressing toward goals    Frequency  7X/week  PT Plan Current plan remains appropriate    Co-evaluation             End of Session Equipment Utilized During Treatment: Gait belt Activity Tolerance: Patient tolerated treatment well Patient left: with call bell/phone within reach;in chair     Time: 1440-1524 PT Time Calculation (min): 44 min  Charges:  $Gait Training: 8-22 mins $Therapeutic Exercise: 8-22 mins $Therapeutic Activity: 8-22 mins                    G Codes:      Allayne Gitelman 11/06/2013, 3:39 PM

## 2013-11-06 NOTE — Evaluation (Signed)
Physical Therapy Evaluation Patient Details Name: Walter Massey MRN: [id number] DOB: [birth year] Today's Date: 11/06/2013   History of Present Illness  s/p L THA. h/o of L THA x 5 years ago and back sx Dec 2014  Clinical Impression  Pt presents with impairments listed below and will benefit from skilled PT intervention to address these impairments and increase functional independence. Pt currently at close S/steady A with RW for gait, stairs, and transfers. Plan for pt to d/c home with intermittent S and recommend follow up HHPT.    Follow Up Recommendations Home health PT;Supervision - Intermittent    Equipment Recommendations  None recommended by PT (Pt already owns RW)    Recommendations for Other Services OT consult     Precautions / Restrictions Precautions Precautions: Posterior Hip Precaution Booklet Issued: Yes (comment) Precaution Comments: Able to recall precautions Restrictions Weight Bearing Restrictions: Yes LLE Weight Bearing: Weight bearing as tolerated      Mobility  Bed Mobility Overal bed mobility: Needs Assistance Bed Mobility: Supine to Sit     Supine to sit: Supervision;Min guard;HOB elevated     General bed mobility comments: Cues for technique to maintain precautions. Pt using SPC handle as leg lifter to aid with mobility and able to do with S  Transfers Overall transfer level: Needs assistance Equipment used: Rolling walker (2 wheeled) Transfers: Sit to/from Stand Sit to Stand: Min guard         General transfer comment: cues for hand placement and technique to maintain hip precautions  Ambulation/Gait Ambulation/Gait assistance: Supervision;Min guard Ambulation Distance (Feet): 120 Feet Assistive device: Rolling walker (2 wheeled) Gait Pattern/deviations: Decreased stance time - left;Decreased stride length;Antalgic     General Gait Details: Initial cues for posture with RW. Steady A for turns/balance but no overt LOB  occurred.  Stairs Stairs: Yes Stairs assistance: Min guard Stair Management: One rail Right;Two rails;Step to pattern Number of Stairs: 5 General stair comments: Pt able to recall sequence for which foot to lead with and demonstrated. Recommend S initially for stair negotiation for safety and pt in agreement.  Wheelchair Mobility    Modified Rankin (Stroke Patients Only)       Balance Overall balance assessment: Needs assistance   Sitting balance-Leahy Scale: Fair Sitting balance - Comments: leaning away from operated site (L hip)     Standing balance-Leahy Scale: Poor Standing balance comment: requires UE support for balance                             Pertinent Vitals/Pain Pain Assessment: 0-10 Pain Score: 4  Pain Location: L hip Pain Descriptors / Indicators: Sore Pain Intervention(s): Premedicated before session;Repositioned    Home Living Family/patient expects to be discharged to:: Private residence Living Arrangements: Alone Available Help at Discharge: Family;Friend(s);Available PRN/intermittently (Pt reports his son's gf will stay with him first few days) Type of Home: House Home Access: Stairs to enter Entrance Stairs-Rails: Right Entrance Stairs-Number of Steps: 5 Home Layout: Multi-level;Able to live on main level with bedroom/bathroom Home Equipment: Gilford Rile - 2 wheels;Cane - single point      Prior Function Level of Independence: Independent         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 Hip limited movement in gravity eliminated position; 3/5 at knee and ankel WFL  Cervical / Trunk Assessment: Normal  Communication   Communication: No difficulties  Cognition Arousal/Alertness:  Awake/alert Behavior During Therapy: WFL for tasks assessed/performed Overall Cognitive Status: Within Functional Limits for tasks assessed                      General Comments General comments (skin integrity, edema, etc.): education on edema control (elevation, ice, ankle pumps)    Exercises Total Joint Exercises Ankle Circles/Pumps: AROM;Both;15 reps Hip ABduction/ADduction: AAROM;Left;5 reps Long Arc Quad: AROM;Strengthening;Left;15 reps      Assessment/Plan    PT Assessment Patient needs continued PT services  PT Diagnosis Abnormality of gait;Acute pain   PT Problem List Decreased strength;Decreased range of motion;Decreased activity tolerance;Decreased balance;Decreased mobility;Decreased knowledge of use of DME;Decreased knowledge of precautions;Pain  PT Treatment Interventions DME instruction;Gait training;Stair training;Functional mobility training;Therapeutic activities;Therapeutic exercise;Balance training;Neuromuscular re-education;Patient/family education   PT Goals (Current goals can be found in the Care Plan section) Acute Rehab PT Goals Patient Stated Goal: go home tomorrow PT Goal Formulation: With patient Time For Goal Achievement: 11/13/13 Potential to Achieve Goals: Good    Frequency 7X/week   Barriers to discharge   Recommend intermittent S which pt reports he has available. His son's girlfriend plans to stay with him initially and he said neighbors can check on him also    Co-evaluation               End of Session Equipment Utilized During Treatment: Gait belt Activity Tolerance: Patient tolerated treatment well Patient left: in chair;with call bell/phone within reach Nurse Communication: Mobility status;Precautions         Time: 0813-0850 PT Time Calculation (min): 37 min   Charges:   PT Evaluation $Initial PT Evaluation Tier I: 1 Procedure PT Treatments $Gait Training: 8-22 mins $Therapeutic Exercise: 8-22 mins   PT G  Codes:          Allayne Gitelman 11/06/2013, 9:33 AM

## 2013-11-06 NOTE — Progress Notes (Signed)
Patient ID: Walter Massey, male   DOB: [birth year], 68 y.o.   MRN: [id number] PATIENT ID: Walter Massey  MRN: [id number]  DOB/AGE:  1945/11/29 / 68 y.o.  1 Day Post-Op Procedure(s) (LRB): LEFT TOTAL HIP ARTHROPLASTY (Left)    PROGRESS NOTE Subjective: Patient is alert, oriented, x1 Nausea, no Vomiting, yes passing gas, no Bowel Movement. Taking PO well. Denies SOB, Chest or Calf Pain. Using Incentive Spirometer, PAS in place. Ambulate WBAT Patient reports pain as 6 on 0-10 scale  .    Objective: Vital signs in last 24 hours: Filed Vitals:   11/06/13 0000 11/06/13 0050 11/06/13 0400 11/06/13 0500  BP:  136/81  162/98  Pulse:  72  75  Temp:  98.2 F (36.8 C)  97.8 F (36.6 C)  TempSrc:      Resp: 16 16 16 16   Height:      Weight:      SpO2: 95% 96% 93% 96%      Intake/Output from previous day: I/O last 3 completed shifts: In: 2223.3 [I.V.:2223.3] Out: 200 [Blood:200]   Intake/Output this shift:     LABORATORY DATA:  Recent Labs  11/06/13 0501  WBC 14.5*  HGB 15.1  HCT 43.7  PLT 253  NA 142  K 4.1  CL 98  CO2 28  BUN 11  CREATININE 1.04  GLUCOSE 139*  CALCIUM 9.3    Examination: Neurologically intact ABD soft Neurovascular intact Sensation intact distally Intact pulses distally Dorsiflexion/Plantar flexion intact Incision: scant drainage No cellulitis present Compartment soft} XR AP&Lat of hip shows well placed\fixed THA  Assessment:   1 Day Post-Op Procedure(s) (LRB): LEFT TOTAL HIP ARTHROPLASTY (Left) ADDITIONAL DIAGNOSIS:  Expected Acute Blood Loss Anemia,   Plan: PT/OT WBAT, THA  posterior precautions  DVT Prophylaxis: SCDx72 hrs, ASA 325 mg BID x 2 weeks  DISCHARGE PLAN: Home  DISCHARGE NEEDS: HHPT, HHRN, CPM, Walker and 3-in-1 comode seat

## 2013-11-07 LAB — CBC
HEMATOCRIT: 40.7 % (ref 39.0–52.0)
HEMOGLOBIN: 14.1 g/dL (ref 13.0–17.0)
MCH: 34.2 pg — AB (ref 26.0–34.0)
MCHC: 34.6 g/dL (ref 30.0–36.0)
MCV: 98.8 fL (ref 78.0–100.0)
Platelets: 181 10*3/uL (ref 150–400)
RBC: 4.12 MIL/uL — AB (ref 4.22–5.81)
RDW: 11.9 % (ref 11.5–15.5)
WBC: 14.5 10*3/uL — ABNORMAL HIGH (ref 4.0–10.5)

## 2013-11-07 NOTE — Progress Notes (Signed)
PATIENT ID: Walter Massey  MRN: [id number]  DOB/AGE:  1945/11/05 / 68 y.o.  2 Days Post-Op Procedure(s) (LRB): LEFT TOTAL HIP ARTHROPLASTY (Left)    PROGRESS NOTE Subjective: Patient is alert, oriented, no Nausea, no Vomiting, yes passing gas, yes Bowel Movement. Taking PO ok with small bites and fluids. Denies SOB, Chest or Calf Pain. Using Incentive Spirometer, PAS in place. Ambulate WABT Patient reports pain as 5 on 0-10 scale  .    Objective: Vital signs in last 24 hours: Filed Vitals:   11/06/13 1349 11/06/13 2057 11/06/13 2338 11/07/13 0545  BP: 109/59 126/73  140/73  Pulse: 102 118  85  Temp: 98.1 F (36.7 C) 99.9 F (37.7 C) 98.1 F (36.7 C) 97.8 F (36.6 C)  TempSrc:   Oral   Resp: 17 16  18   Height:      Weight:      SpO2: 92% 93%  93%      Intake/Output from previous day: I/O last 3 completed shifts: In: 2103.3 [P.O.:880; I.V.:1223.3] Out: 1300 [Urine:1300]   Intake/Output this shift:     LABORATORY DATA:  Recent Labs  11/06/13 0501  WBC 14.5*  HGB 15.1  HCT 43.7  PLT 253  NA 142  K 4.1  CL 98  CO2 28  BUN 11  CREATININE 1.04  GLUCOSE 139*  CALCIUM 9.3    Examination: Neurologically intact Neurovascular intact Sensation intact distally Intact pulses distally Dorsiflexion/Plantar flexion intact Incision: scant drainage No cellulitis present Compartment soft} XR AP&Lat of hip shows well placed\fixed THA  Assessment:   2 Days Post-Op Procedure(s) (LRB): LEFT TOTAL HIP ARTHROPLASTY (Left) ADDITIONAL DIAGNOSIS:  Expected Acute Blood Loss Anemia,  Plan: PT/OT WBAT, THA  posterior precautions  DVT Prophylaxis: SCDx72 hrs, ASA 325 mg BID x 2 weeks  DISCHARGE PLAN: Home, today after therapy  DISCHARGE NEEDS: HHPT, HHRN, Walker and 3-in-1 comode seat

## 2013-11-07 NOTE — Progress Notes (Signed)
Physical Therapy Treatment Patient Details Name: Walter Massey MRN: [id number] DOB: 1945/09/20 Today's Date: 11/07/2013    History of Present Illness s/p L THA. h/o of R THA x 5 years ago and back sx Dec 2014    PT Comments    Patient is having most difficulty with sit to stand. Patient plans discharge today.will see again for bed mobility and ambulation  .  Follow Up Recommendations  Home health PT;Supervision - Intermittent     Equipment Recommendations  None recommended by PT    Recommendations for Other Services       Precautions / Restrictions Precautions Precautions: Posterior Hip Precaution Comments: Able to recall precautions Restrictions Weight Bearing Restrictions: Yes LLE Weight Bearing: Weight bearing as tolerated    Mobility  Bed Mobility                  Transfers Overall transfer level: Needs assistance Equipment used: Rolling walker (2 wheeled) Transfers: Sit to/from Stand Sit to Stand: Min guard         General transfer comment: cues for hand placement and technique, difficult from low surface, extra time.  Ambulation/Gait Ambulation/Gait assistance: Supervision Ambulation Distance (Feet): 200 Feet Assistive device: Rolling walker (2 wheeled) Gait Pattern/deviations: Step-through pattern;Decreased step length - left;Antalgic     General Gait Details: gait sequence smoothe   Stairs Stairs: Yes Stairs assistance: Min guard Stair Management: Two rails Number of Stairs: 5 General stair comments: simulated  a rail as wall which patient has at home. Patient also reports he can use rail and cane, cane was not available at the time  that patient was in the gym to practice.  Wheelchair Mobility    Modified Rankin (Stroke Patients Only)       Balance                                    Cognition Arousal/Alertness: Awake/alert                          Exercises      General Comments         Pertinent Vitals/Pain Pain Score: 4  Pain Location: L hip Pain Descriptors / Indicators: Sore Pain Intervention(s): Monitored during session    Home Living                      Prior Function            PT Goals (current goals can now be found in the care plan section) Progress towards PT goals: Progressing toward goals    Frequency  7X/week    PT Plan Current plan remains appropriate    Co-evaluation             End of Session Equipment Utilized During Treatment: Gait belt Activity Tolerance: Patient tolerated treatment well Patient left: with call bell/phone within reach (in bathroom)     Time: 2500-3704 PT Time Calculation (min): 19 min  Charges:  $Gait Training: 8-22 mins                    G Codes:      [name] 11/07/2013, 11:23 AM Tresa Endo PT [phone]

## 2013-11-07 NOTE — Discharge Summary (Signed)
Patient ID: Walter Massey MRN: [id number] DOB/AGE: Apr 06, 1945 68 y.o.  Admit date: 11/05/2013 Discharge date: 11/07/2013  Admission Diagnoses:  Principal Problem:   Arthritis of left hip   Discharge Diagnoses:  Same  Past Medical History  Diagnosis Date  . GERD (gastroesophageal reflux disease)   . Hepatitis     HEP B- history of  . Arthritis     OA- "all over"    Surgeries: Procedure(s): LEFT TOTAL HIP ARTHROPLASTY on 11/05/2013   Consultants:    Discharged Condition: Improved  Hospital Course: Walter Massey is an 68 y.o. male who was admitted 11/05/2013 for operative treatment ofArthritis of left hip. Patient has severe unremitting pain that affects sleep, daily activities, and work/hobbies. After pre-op clearance the patient was taken to the operating room on 11/05/2013 and underwent  Procedure(s): LEFT TOTAL HIP ARTHROPLASTY.    Patient was given perioperative antibiotics: Anti-infectives   Start     Dose/Rate Route Frequency Ordered Stop   11/05/13 0600  ceFAZolin (ANCEF) IVPB 2 g/50 mL premix     2 g 100 mL/hr over 30 Minutes Intravenous On call to O.R. 11/04/13 1233 11/05/13 0953       Patient was given sequential compression devices, early ambulation, and chemoprophylaxis to prevent DVT.  Patient benefited maximally from hospital stay and there were no complications.    Recent vital signs: Patient Vitals for the past 24 hrs:  BP Temp Temp src Pulse Resp SpO2  11/07/13 0545 140/73 mmHg 97.8 F (36.6 C) - 85 18 93 %  11/06/13 2338 - 98.1 F (36.7 C) Oral - - -  11/06/13 2057 126/73 mmHg 99.9 F (37.7 C) - 118 16 93 %  11/06/13 1349 109/59 mmHg 98.1 F (36.7 C) - 102 17 92 %  11/06/13 1200 - - - - 16 92 %     Recent laboratory studies:  Recent Labs  11/06/13 0501  WBC 14.5*  HGB 15.1  HCT 43.7  PLT 253  NA 142  K 4.1  CL 98  CO2 28  BUN 11  CREATININE 1.04  GLUCOSE 139*  CALCIUM 9.3     Discharge Medications:     Medication List     STOP taking these medications       aspirin 81 MG tablet  Replaced by:  aspirin EC 325 MG tablet     etodolac 400 MG tablet  Commonly known as:  LODINE      TAKE these medications       acetaminophen 325 MG tablet  Commonly known as:  TYLENOL  Take 325 mg by mouth every 6 (six) hours as needed for mild pain.     aspirin EC 325 MG tablet  Take 1 tablet (325 mg total) by mouth 2 (two) times daily.     CENTRUM PO  Take 1 tablet by mouth daily.     methocarbamol 500 MG tablet  Commonly known as:  ROBAXIN  Take 1 tablet (500 mg total) by mouth 2 (two) times daily with a meal.     omega-3 acid ethyl esters 1 G capsule  Commonly known as:  LOVAZA  Take 2 g by mouth daily.     omeprazole 20 MG capsule  Commonly known as:  PRILOSEC  Take 20 mg by mouth daily with breakfast.     oxyCODONE-acetaminophen 5-325 MG per tablet  Commonly known as:  ROXICET  Take 1 tablet by mouth every 4 (four) hours as needed.  Diagnostic Studies: Dg Pelvis Portable  11/05/2013   CLINICAL DATA:  Left total hip arthroplasty  EXAM: PORTABLE PELVIS 1-2 VIEWS  COMPARISON:  None.  FINDINGS: Interval left total hip arthroplasty without failure or complication. No fracture or dislocation. Postsurgical changes in the surrounding soft tissues.  Prior right total hip arthroplasty without failure, complication, fracture or dislocation.  IMPRESSION: Interval left total hip arthroplasty.   Electronically Signed   By: Kathreen Devoid   On: 11/05/2013 12:05    Disposition: 01-Home or Self Care      Discharge Instructions   Call MD / Call 911    Complete by:  As directed   If you experience chest pain or shortness of breath, CALL 911 and be transported to the hospital emergency room.  If you develope a fever above 101 F, pus (white drainage) or increased drainage or redness at the wound, or calf pain, call your surgeon's office.     Change dressing    Complete by:  As directed   You may change your  dressing on day 5, then change the dressing daily with sterile 4 x 4 inch gauze dressing and paper tape.  You may clean the incision with alcohol prior to redressing     Constipation Prevention    Complete by:  As directed   Drink plenty of fluids.  Prune juice may be helpful.  You may use a stool softener, such as Colace (over the counter) 100 mg twice a day.  Use MiraLax (over the counter) for constipation as needed.     Diet - low sodium heart healthy    Complete by:  As directed      Discharge instructions    Complete by:  As directed   Follow up in office with Dr. Mayer Camel in 2 weeks.     Driving restrictions    Complete by:  As directed   No driving for 2 weeks     Follow the hip precautions as taught in Physical Therapy    Complete by:  As directed      Increase activity slowly as tolerated    Complete by:  As directed      Patient may shower    Complete by:  As directed   You may shower without a dressing once there is no drainage.  Do not wash over the wound.  If drainage remains, cover wound with plastic wrap and then shower.           Follow-up Information   Follow up with Kerin Salen, MD In 2 weeks.   Specialty:  Orthopedic Surgery   Contact information:   [place] [id number] [phone]        Signed: Hardin Negus Adiyah Lame R 11/07/2013, 8:26 AM

## 2013-11-07 NOTE — Evaluation (Signed)
Occupational Therapy Evaluation Patient Details Name: Walter Massey MRN: [id number] DOB: 1945-10-20 Today's Date: 11/07/2013    History of Present Illness s/p L THA. h/o of R THA x 5 years ago and back sx Dec 2014   Clinical Impression   Pt admitted with the above diagnoses and presents with below problem list. Pt will benefit from continued acute OT to address the below listed deficits and maximize independence with basic ADLs prior to d/c home. PTA pt was independent with ADLs. Pt currently at supervision level for ADLs. ADL education provided. Pt completed toilet transfer ambulating to 3n1 over toilet at supervision level.       Follow Up Recommendations  Supervision - Intermittent;No OT follow up    Equipment Recommendations  Other (comment);None recommended by OT (pt has BSC)    Recommendations for Other Services       Precautions / Restrictions Precautions Precautions: Posterior Hip Precaution Comments: Able to recall precautions Restrictions Weight Bearing Restrictions: Yes LLE Weight Bearing: Weight bearing as tolerated      Mobility Bed Mobility Overal bed mobility: Needs Assistance Bed Mobility: Sit to Supine     Supine to sit: Min guard Sit to supine: Min guard   General bed mobility comments: pt used cane to assist LLE up to and across bed. used bed rails to pull self up towards HOB in supine.  Transfers Overall transfer level: Needs assistance Equipment used: Rolling walker (2 wheeled) Transfers: Sit to/from Stand Sit to Stand: Supervision         General transfer comment: extra time, higher surface utilized    Balance Overall balance assessment: Needs assistance         Standing balance support: Bilateral upper extremity supported;During functional activity Standing balance-Leahy Scale: Poor                              ADL Overall ADL's : Needs assistance/impaired Eating/Feeding: Set up;Sitting   Grooming: Set  up;Sitting;Standing   Upper Body Bathing: Set up;Sitting   Lower Body Bathing: Sit to/from stand;Supervison/ safety;With adaptive equipment   Upper Body Dressing : Set up;Sitting   Lower Body Dressing: Supervision/safety;With adaptive equipment;Sit to/from Archivist: Ambulation;RW;Supervision/safety (3n1 over toilet)   Toileting- Clothing Manipulation and Hygiene: Sit to/from stand;Supervision/safety   Tub/ Shower Transfer: Supervision/safety;Ambulation;3 in 1;Rolling walker   Functional mobility during ADLs: Supervision/safety;Rolling walker General ADL Comments: Education provided on techniques and AE for safe completion of ADLs with hip precautions. PT familiar with techniques and AE from previous hip surgery.      Vision                     Perception     Praxis      Pertinent Vitals/Pain Pain Assessment: 0-10 Pain Score: 4  Pain Location: L hip Pain Descriptors / Indicators: Aching Pain Intervention(s): Limited activity within patient's tolerance;Monitored during session;Ice applied     Hand Dominance     Extremity/Trunk Assessment Upper Extremity Assessment Upper Extremity Assessment: Overall WFL for tasks assessed   Lower Extremity Assessment Lower Extremity Assessment: Defer to PT evaluation       Communication Communication Communication: No difficulties   Cognition Arousal/Alertness: Awake/alert Behavior During Therapy: WFL for tasks assessed/performed Overall Cognitive Status: Within Functional Limits for tasks assessed                     General Comments  Exercises       Shoulder Instructions      Home Living Family/patient expects to be discharged to:: Private residence Living Arrangements: Alone Available Help at Discharge: Family;Friend(s);Available PRN/intermittently Type of Home: House Home Access: Stairs to enter [employer] of Steps: 5 Entrance Stairs-Rails: Right Home Layout:  Multi-level;Able to live on main level with bedroom/bathroom Alternate Level Stairs-Number of Steps: flight   Bathroom Shower/Tub: Teacher, early years/pre: Standard Bathroom Accessibility: Yes How Accessible: Accessible via walker Home Equipment: Walker - 2 wheels;Cane - single point;Bedside commode;Adaptive equipment Adaptive Equipment: Reacher;Sock aid Additional Comments: pt familiar with ADL techniques and AE from previous hip surgery      Prior Functioning/Environment Level of Independence: Independent        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 Diagnosis: Acute pain   OT Problem List: Impaired balance (sitting and/or standing);Decreased knowledge of use of DME or AE;Decreased knowledge of precautions;Pain   OT Treatment/Interventions: Self-care/ADL training;Therapeutic exercise;DME and/or AE instruction;Therapeutic activities;Patient/family education;Balance training    OT Goals(Current goals can be found in the care plan section) Acute Rehab OT Goals Patient Stated Goal: not stated OT Goal Formulation: With patient Time For Goal Achievement: 11/14/13 Potential to Achieve Goals: Good ADL Goals Pt Will Perform Lower Body Dressing: with modified independence;with adaptive equipment;sit to/from stand Pt Will Transfer to Toilet: with modified independence;ambulating (3n1 over toilet) Pt Will Perform Toileting - Clothing Manipulation and hygiene: with modified independence;sit to/from stand Pt Will Perform Tub/Shower Transfer: with modified independence;ambulating;3 in 1;rolling walker  OT Frequency: Min 2X/week   Barriers to D/C:            Co-evaluation              End of Session Equipment Utilized During Treatment: Gait belt;Rolling walker  Activity Tolerance: Patient tolerated treatment well Patient left: in bed;with call bell/phone within reach   Time: 1258-1314 OT Time Calculation (min): 16 min Charges:  OT General Charges $OT Visit: 1  Procedure OT Evaluation $Initial OT Evaluation Tier I: 1 Procedure OT Treatments $Self Care/Home Management : 8-22 mins G-Codes:    [name] Nov 08, 2013, 2:19 PM

## 2013-11-07 NOTE — Progress Notes (Signed)
Physical Therapy Treatment Patient Details Name: Walter Massey MRN: [id number] DOB: May 26, 1945 Today's Date: 11/07/2013    History of Present Illness s/p L THA. h/o of R THA x 5 years ago and back sx Dec 2014    PT Comments    Improved  Ability for getting into bed, used cane to self assist  LLE onto bed. Will have 1 more session this PM prior to DC. Pt reports that he is having more difficulty with mobility, pt reports having back surgery in  Past and his mobility ha been very limited.  Follow Up Recommendations  Home health PT;Supervision - Intermittent     Equipment Recommendations  None recommended by PT    Recommendations for Other Services       Precautions / Restrictions Precautions Precautions: Posterior Hip Precaution Comments: Able to recall precautions Restrictions Weight Bearing Restrictions: Yes LLE Weight Bearing: Weight bearing as tolerated    Mobility  Bed Mobility   Bed Mobility: Sit to Supine     Supine to sit: Min guard     General bed mobility comments: patient used his cane to self assist LLE onto bed, followed precautions.  extra time and effort required by patient.  Transfers Overall transfer level: Needs assistance Equipment used: Rolling walker (2 wheeled) Transfers: Sit to/from Stand Sit to Stand: Modified independent (Device/Increase time)         General transfer comment: cues for hand placement and technique, difficult from low surface, extra time.  Ambulation/Gait Ambulation/Gait assistance: Supervision Ambulation Distance (Feet): 20 Feet Assistive device: Rolling walker (2 wheeled) Gait Pattern/deviations: Step-through pattern;Decreased step length - left;Antalgic     General Gait Details: gait sequence smoothe,   Stairs  Wheelchair Mobility    Modified Rankin (Stroke Patients Only)       Balance                                    Cognition Arousal/Alertness: Awake/alert                           Exercises Total Joint Exercises Short Arc Quad: AROM;Left;10 reps;Supine Heel Slides: AAROM;Left;10 reps;Supine Hip ABduction/ADduction: AAROM;Strengthening;Left;10 reps    General Comments        Pertinent Vitals/Pain Pain Score: 4  Pain Location: L hip Pain Descriptors / Indicators: Sore Pain Intervention(s): Monitored during session    Home Living                      Prior Function            PT Goals (current goals can now be found in the care plan section) Progress towards PT goals: Progressing toward goals    Frequency  7X/week    PT Plan Current plan remains appropriate    Co-evaluation             End of Session Equipment Utilized During Treatment: Gait belt Activity Tolerance: Patient tolerated treatment well Patient left: in bed;with call bell/phone within reach     Time: 1040-1054 PT Time Calculation (min): 14 min  Charges:  $Gait Training: 8-22 mins $Therapeutic Exercise: 8-22 mins                    G Codes:      [name] 11/07/2013, 11:28 AM Tresa Endo PT [phone]

## 2013-11-07 NOTE — Progress Notes (Signed)
Patient discharged to home. Discharge instructions, follow-up information, and rx given and explained and patient stated understanding. IV was removed and patient left unit in a stable condition with all personal belongings via wheelchair.

## 2013-11-07 NOTE — Care Management Note (Signed)
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 Patient underwent Left posterior arthroplasty.     Action/Plan:   Case manager spoke with pateint concerning home health needs. Patient was preoperatively setup with [facility], no changes. Has rolling walker and 3in1. Has family support at discharge.   Anticipated DC Date:  11/07/2013   Anticipated DC Plan:  [place]  CM consult      [facility] Choice  HOME HEALTH   Choice offered to / List presented to:  C-1 Patient   DME arranged  NA        [place] arranged  [facility]      [place].   Status of service:  Completed, signed off Medicare Important Message given?  NA - LOS <3 / Initial given by admissions (If response is "NO", the following Medicare IM given date fields will be blank) Date Medicare IM given:   Medicare IM given by:   Date Additional Medicare IM given:   Additional Medicare IM given by:    Discharge Disposition:  [place]  Per UR Regulation:  Reviewed for med. necessity/level of care/duration of stay

## 2013-11-07 NOTE — Progress Notes (Signed)
Physical Therapy Treatment Patient Details Name: Walter Massey MRN: [id number] DOB: 04-14-1945 Today's Date: 11/07/2013    History of Present Illness s/p L THA. h/o of R THA x 5 years ago and back sx Dec 2014    PT Comments    Ready to DC.  Follow Up Recommendations  Home health PT;Supervision - Intermittent     Equipment Recommendations       Recommendations for Other Services       Precautions / Restrictions Precautions Precautions: Posterior Hip Precaution Comments: Able to recall precautions Restrictions Weight Bearing Restrictions: Yes LLE Weight Bearing: Weight bearing as tolerated    Mobility  Bed Mobility Overal bed mobility: Needs Assistance Bed Mobility: Supine to Sit     Supine to sit: Min assist Sit to supine: Min guard   General bed mobility comments: still difficulty with bed mobility, may be due to back surgery.  Transfers Overall transfer level: Needs assistance Equipment used: Rolling walker (2 wheeled) Transfers: Sit to/from Stand Sit to Stand: Supervision         General transfer comment: extra time, higher surface utilized  Ambulation/Gait Ambulation/Gait assistance: Supervision Ambulation Distance (Feet): 100 Feet Assistive device: Rolling walker (2 wheeled) Gait Pattern/deviations: Step-through pattern     General Gait Details: gait sequence smoothe,   Stairs            Wheelchair Mobility    Modified Rankin (Stroke Patients Only)       Balance Overall balance assessment: Needs assistance         Standing balance support: Bilateral upper extremity supported;During functional activity Standing balance-Leahy Scale: Poor                      Cognition Arousal/Alertness: Awake/alert Behavior During Therapy: WFL for tasks assessed/performed Overall Cognitive Status: Within Functional Limits for tasks assessed                      Exercises      General Comments        Pertinent  Vitals/Pain Pain Assessment: 0-10 Pain Score: 3  Pain Location: L hip, states walking decreases pain Pain Descriptors / Indicators: Tender;Discomfort Pain Intervention(s): Limited activity within patient's tolerance;Monitored during session;Ice applied    Home Living Family/patient expects to be discharged to:: Private residence Living Arrangements: Alone Available Help at Discharge: Family;Friend(s);Available PRN/intermittently Type of Home: House Home Access: Stairs to enter Entrance Stairs-Rails: Right Home Layout: Multi-level;Able to live on main level with bedroom/bathroom Home Equipment: Gilford Rile - 2 wheels;Cane - single point;Bedside commode;Adaptive equipment Additional Comments: pt familiar with ADL techniques and AE from previous hip surgery    Prior Function Level of Independence: Independent      Comments: Pt works as [employer] officer (current goals can now be found in the care plan section) Acute Rehab PT Goals Patient Stated Goal: not stated Progress towards PT goals: Progressing toward goals    Frequency       PT Plan Current plan remains appropriate    Co-evaluation             End of Session   Activity Tolerance: Patient tolerated treatment well Patient left: in chair;with nursing/sitter in room     Time: 7858-8502 PT Time Calculation (min): 20 min  Charges:  $Gait Training: 8-22 mins                    G Codes:  Walter Massey 11/07/2013, 5:01 PM

## 2014-11-08 ENCOUNTER — Other Ambulatory Visit: Payer: Self-pay | Admitting: Orthopedic Surgery

## 2015-01-05 IMAGING — CR DG LUMBAR SPINE 2-3V
3 series · 3 of 3 positions shown · non-contrast
Comparison: None.

CLINICAL DATA: Preoperative for spine surgery

EXAM:
LUMBAR SPINE - 2-3 VIEW

[t lumbar spine ap]
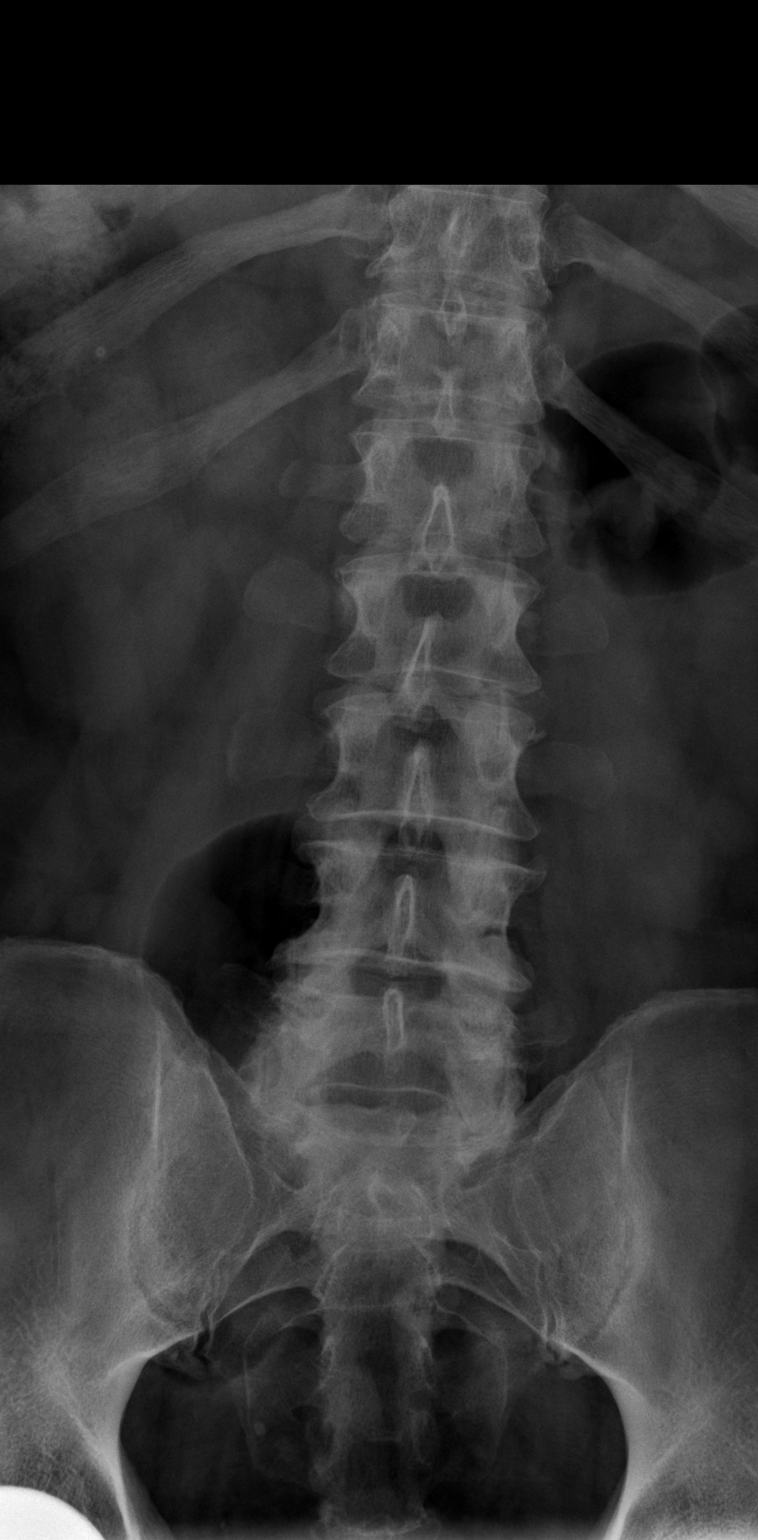

[t lumbar spine lat]
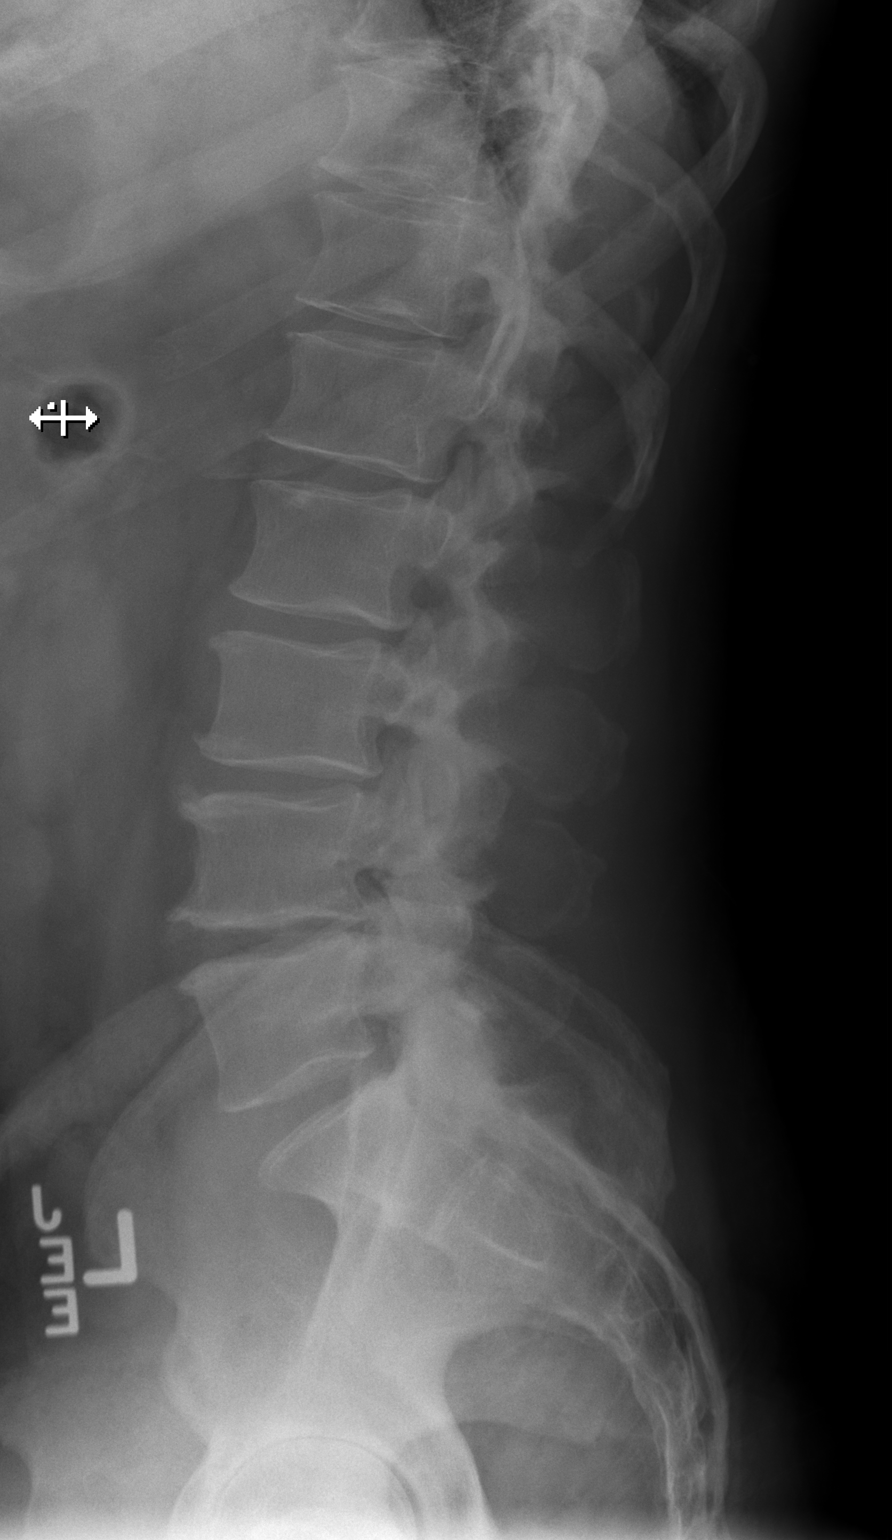

[t lumbar l-5 s-1 spot]
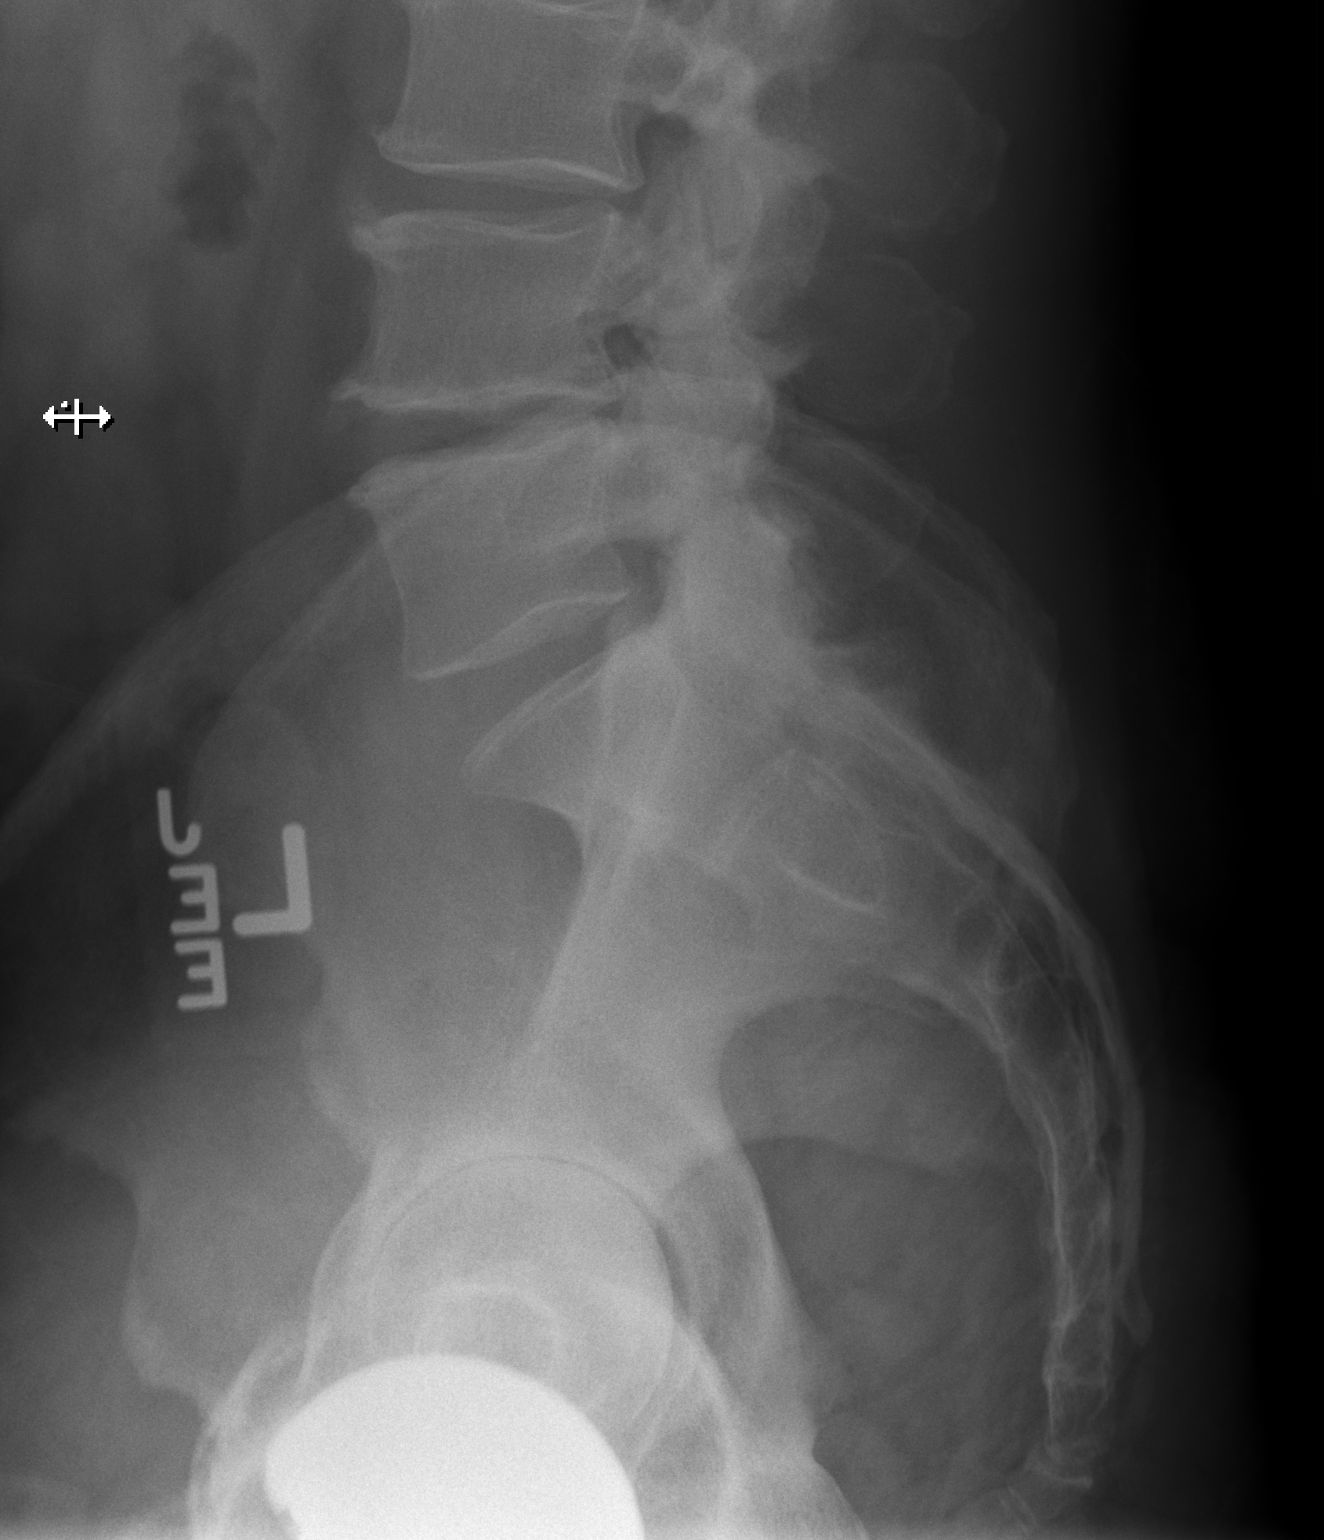

[3 of 3 positions shown; findings below may reference images not displayed]

FINDINGS: The lumbar vertebral bodies are preserved in height. The pedicles
and transverse processes appear intact. There is mild narrowing of
the L4-5 disc. There is mild facet joint degenerative change at L4-5
and L5-S1. There is no pars defect or spondylolisthesis appear all
anterior endplate osteophytes are noted at L3, L4, and L5.
IMPRESSION: There are mild degenerative changes of the mid and lower lumbar
spine. The vertebral levels are as numbered.

## 2015-09-01 ENCOUNTER — Other Ambulatory Visit: Payer: Self-pay | Admitting: Family Medicine

## 2015-09-01 DIAGNOSIS — M545 Low back pain: Secondary | ICD-10-CM

## 2015-09-11 ENCOUNTER — Ambulatory Visit
Admission: RE | Admit: 2015-09-11 | Discharge: 2015-09-11 | Disposition: A | Discharge status: Home / Self Care | Payer: Medicare Other | Source: Ambulatory Visit | Attending: Family Medicine | Admitting: Family Medicine

## 2015-09-11 DIAGNOSIS — M4806 Spinal stenosis, lumbar region: Secondary | ICD-10-CM | POA: Diagnosis not present

## 2015-09-11 DIAGNOSIS — M545 Low back pain: Secondary | ICD-10-CM

## 2015-09-29 DIAGNOSIS — M5136 Other intervertebral disc degeneration, lumbar region: Secondary | ICD-10-CM | POA: Diagnosis not present

## 2015-09-29 DIAGNOSIS — G894 Chronic pain syndrome: Secondary | ICD-10-CM | POA: Diagnosis not present

## 2015-09-29 DIAGNOSIS — M961 Postlaminectomy syndrome, not elsewhere classified: Secondary | ICD-10-CM | POA: Diagnosis not present

## 2015-09-30 DIAGNOSIS — R7309 Other abnormal glucose: Secondary | ICD-10-CM | POA: Diagnosis not present

## 2015-09-30 DIAGNOSIS — N529 Male erectile dysfunction, unspecified: Secondary | ICD-10-CM | POA: Diagnosis not present

## 2015-09-30 DIAGNOSIS — Z0000 Encounter for general adult medical examination without abnormal findings: Secondary | ICD-10-CM | POA: Diagnosis not present

## 2015-09-30 DIAGNOSIS — M4800 Spinal stenosis, site unspecified: Secondary | ICD-10-CM | POA: Diagnosis not present

## 2015-09-30 DIAGNOSIS — Z23 Encounter for immunization: Secondary | ICD-10-CM | POA: Diagnosis not present

## 2015-09-30 DIAGNOSIS — E782 Mixed hyperlipidemia: Secondary | ICD-10-CM | POA: Diagnosis not present

## 2015-09-30 DIAGNOSIS — E669 Obesity, unspecified: Secondary | ICD-10-CM | POA: Diagnosis not present

## 2015-09-30 DIAGNOSIS — Z125 Encounter for screening for malignant neoplasm of prostate: Secondary | ICD-10-CM | POA: Diagnosis not present

## 2015-09-30 DIAGNOSIS — M1990 Unspecified osteoarthritis, unspecified site: Secondary | ICD-10-CM | POA: Diagnosis not present

## 2015-09-30 DIAGNOSIS — F322 Major depressive disorder, single episode, severe without psychotic features: Secondary | ICD-10-CM | POA: Diagnosis not present

## 2015-10-21 DIAGNOSIS — M5416 Radiculopathy, lumbar region: Secondary | ICD-10-CM | POA: Diagnosis not present

## 2015-11-06 DIAGNOSIS — M4807 Spinal stenosis, lumbosacral region: Secondary | ICD-10-CM | POA: Diagnosis not present

## 2015-11-06 DIAGNOSIS — M961 Postlaminectomy syndrome, not elsewhere classified: Secondary | ICD-10-CM | POA: Diagnosis not present

## 2015-11-06 DIAGNOSIS — M5136 Other intervertebral disc degeneration, lumbar region: Secondary | ICD-10-CM | POA: Diagnosis not present

## 2015-11-14 ENCOUNTER — Other Ambulatory Visit: Payer: Self-pay | Admitting: Specialist

## 2015-11-14 DIAGNOSIS — M4807 Spinal stenosis, lumbosacral region: Secondary | ICD-10-CM | POA: Diagnosis not present

## 2015-11-14 DIAGNOSIS — M5136 Other intervertebral disc degeneration, lumbar region: Secondary | ICD-10-CM | POA: Diagnosis not present

## 2015-11-14 DIAGNOSIS — M961 Postlaminectomy syndrome, not elsewhere classified: Secondary | ICD-10-CM | POA: Diagnosis not present

## 2015-11-16 IMAGING — CR DG PORTABLE PELVIS
1 series · 1 of 1 positions shown · non-contrast
Comparison: None.

CLINICAL DATA: Left total hip arthroplasty

EXAM:
PORTABLE PELVIS 1-2 VIEWS

[ap]
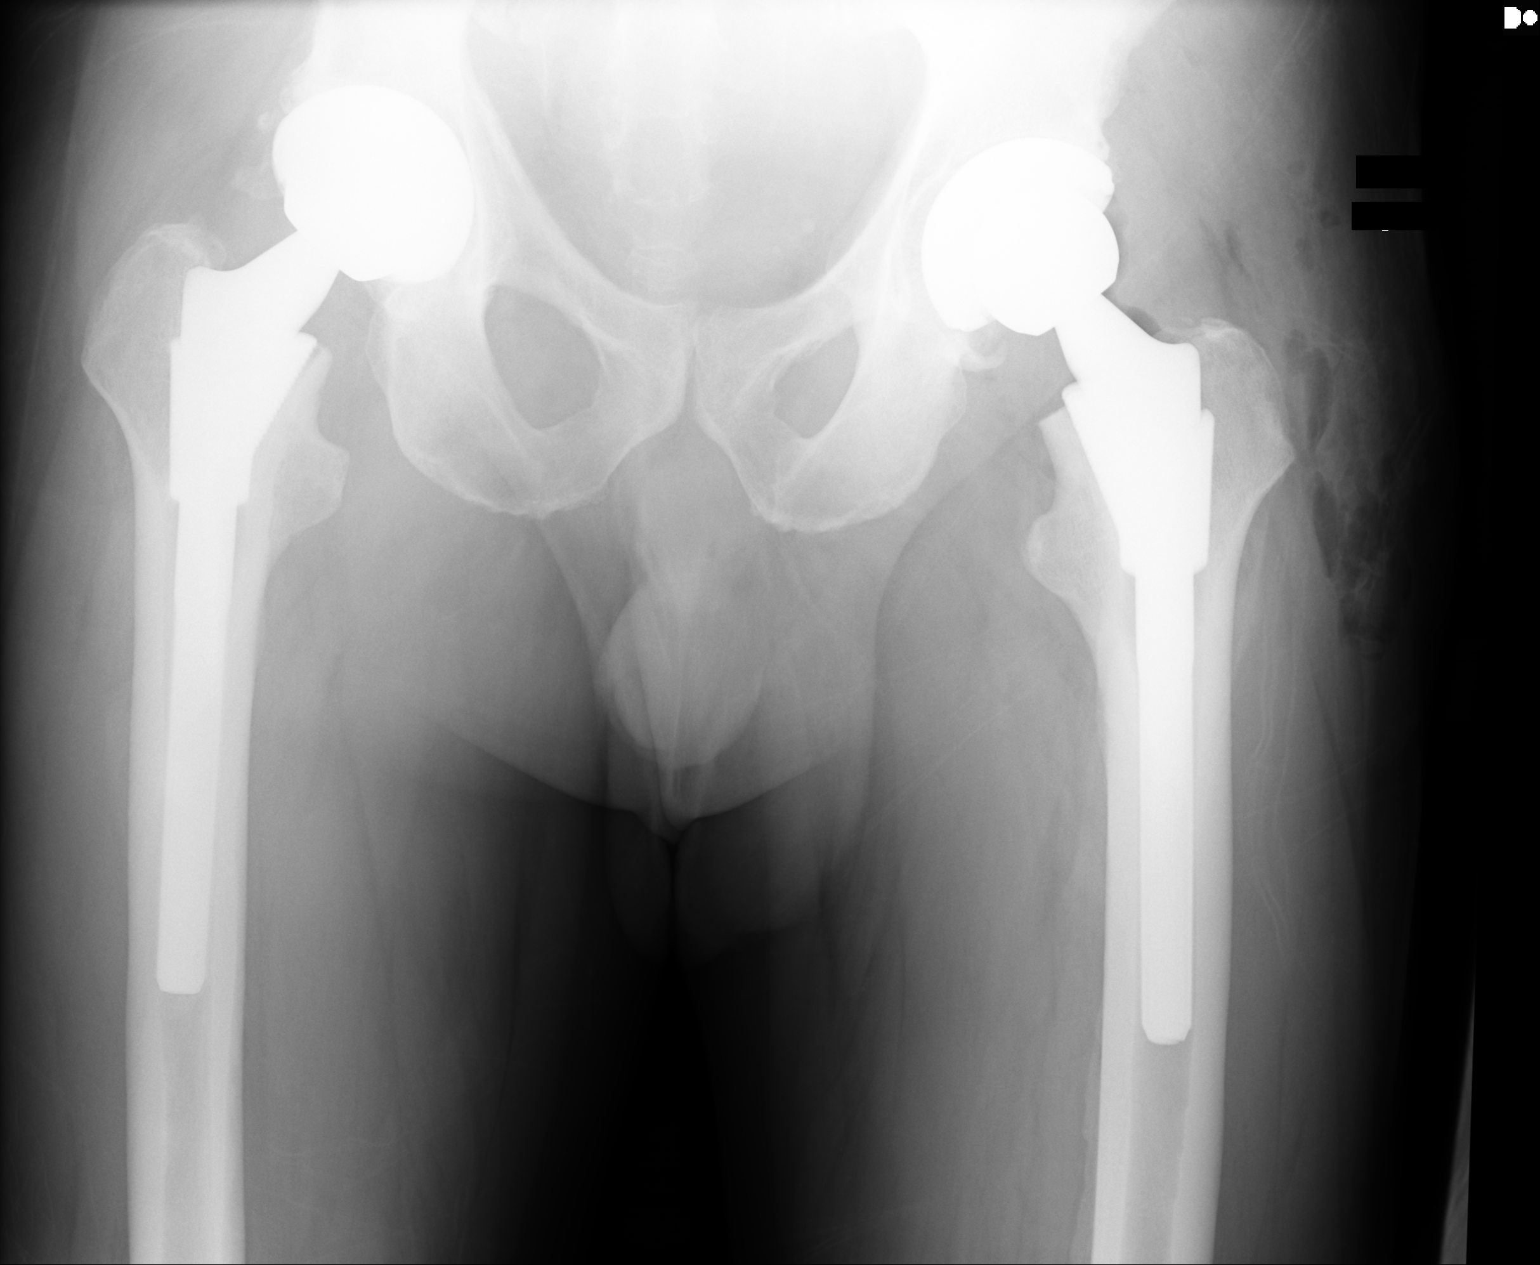

[1 of 1 positions shown; findings below may reference images not displayed]

FINDINGS: Interval left total hip arthroplasty without failure or
complication. No fracture or dislocation. Postsurgical changes in
the surrounding soft tissues.

Prior right total hip arthroplasty without failure, complication,
fracture or dislocation.
IMPRESSION: Interval left total hip arthroplasty.

## 2015-11-26 DIAGNOSIS — M5136 Other intervertebral disc degeneration, lumbar region: Secondary | ICD-10-CM | POA: Diagnosis not present

## 2015-11-28 ENCOUNTER — Ambulatory Visit
Admission: RE | Admit: 2015-11-28 | Discharge: 2015-11-28 | Disposition: A | Discharge status: Home / Self Care | Payer: Medicare Other | Source: Ambulatory Visit | Attending: Specialist | Admitting: Specialist

## 2015-11-28 DIAGNOSIS — M961 Postlaminectomy syndrome, not elsewhere classified: Secondary | ICD-10-CM

## 2015-11-28 DIAGNOSIS — M48061 Spinal stenosis, lumbar region without neurogenic claudication: Secondary | ICD-10-CM | POA: Diagnosis not present

## 2015-11-28 MED ORDER — IOPAMIDOL (ISOVUE-M 200) INJECTION 41%
15.0000 mL | Freq: Once | INTRAMUSCULAR | Status: AC
  Administered 2015-11-28: 15 mL via INTRATHECAL

## 2015-11-28 MED ORDER — DIAZEPAM 5 MG PO TABS
5.0000 mg | ORAL_TABLET | Freq: Once | ORAL | Status: AC
  Administered 2015-11-28: 5 mg via ORAL

## 2015-11-28 MED ORDER — MEPERIDINE HCL 100 MG/ML IJ SOLN
100.0000 mg | Freq: Once | INTRAMUSCULAR | Status: AC
  Administered 2015-11-28: 100 mg via INTRAMUSCULAR

## 2015-11-28 MED ORDER — ONDANSETRON HCL 4 MG/2ML IJ SOLN
4.0000 mg | Freq: Once | INTRAMUSCULAR | Status: AC
  Administered 2015-11-28: 4 mg via INTRAMUSCULAR

## 2015-11-28 MED ORDER — ONDANSETRON HCL 4 MG/2ML IJ SOLN: 4.0000 mg | Freq: Four times a day (QID) | INTRAMUSCULAR | Status: DC | PRN

## 2015-11-28 NOTE — Discharge Instructions (Signed)

## 2015-12-01 ENCOUNTER — Telehealth: Payer: Self-pay

## 2015-12-01 NOTE — Telephone Encounter (Signed)
Spoke with patient after he had a myelogram here 11/28/15.  He says his back was really sore for the two days after the procedure because of the bedrest, but that he is much better now.  Denies any headache.  jkl

## 2015-12-13 DIAGNOSIS — M5136 Other intervertebral disc degeneration, lumbar region: Secondary | ICD-10-CM | POA: Diagnosis not present

## 2015-12-16 DIAGNOSIS — M4807 Spinal stenosis, lumbosacral region: Secondary | ICD-10-CM | POA: Diagnosis not present

## 2015-12-16 DIAGNOSIS — M5136 Other intervertebral disc degeneration, lumbar region: Secondary | ICD-10-CM | POA: Diagnosis not present

## 2015-12-23 DIAGNOSIS — M25552 Pain in left hip: Secondary | ICD-10-CM | POA: Diagnosis not present

## 2016-01-02 DIAGNOSIS — G5702 Lesion of sciatic nerve, left lower limb: Secondary | ICD-10-CM | POA: Diagnosis not present

## 2016-01-06 DIAGNOSIS — G5702 Lesion of sciatic nerve, left lower limb: Secondary | ICD-10-CM | POA: Diagnosis not present

## 2016-01-20 DIAGNOSIS — G5702 Lesion of sciatic nerve, left lower limb: Secondary | ICD-10-CM | POA: Diagnosis not present

## 2016-01-20 DIAGNOSIS — M25552 Pain in left hip: Secondary | ICD-10-CM | POA: Diagnosis not present

## 2016-01-29 DIAGNOSIS — M25532 Pain in left wrist: Secondary | ICD-10-CM | POA: Diagnosis not present

## 2016-03-29 DIAGNOSIS — L570 Actinic keratosis: Secondary | ICD-10-CM | POA: Diagnosis not present

## 2016-03-29 DIAGNOSIS — L821 Other seborrheic keratosis: Secondary | ICD-10-CM | POA: Diagnosis not present

## 2016-03-29 DIAGNOSIS — L578 Other skin changes due to chronic exposure to nonionizing radiation: Secondary | ICD-10-CM | POA: Diagnosis not present

## 2016-04-07 ENCOUNTER — Other Ambulatory Visit: Payer: Self-pay | Admitting: Family Medicine

## 2016-04-07 DIAGNOSIS — M549 Dorsalgia, unspecified: Secondary | ICD-10-CM | POA: Diagnosis not present

## 2016-04-07 DIAGNOSIS — R7309 Other abnormal glucose: Secondary | ICD-10-CM | POA: Diagnosis not present

## 2016-04-07 DIAGNOSIS — E669 Obesity, unspecified: Secondary | ICD-10-CM | POA: Diagnosis not present

## 2016-04-07 DIAGNOSIS — N281 Cyst of kidney, acquired: Secondary | ICD-10-CM | POA: Diagnosis not present

## 2016-04-07 DIAGNOSIS — E782 Mixed hyperlipidemia: Secondary | ICD-10-CM | POA: Diagnosis not present

## 2016-04-09 ENCOUNTER — Ambulatory Visit
Admission: RE | Admit: 2016-04-09 | Discharge: 2016-04-09 | Disposition: A | Discharge status: Home / Self Care | Payer: Medicare Other | Source: Ambulatory Visit | Attending: Family Medicine | Admitting: Family Medicine

## 2016-04-09 ENCOUNTER — Other Ambulatory Visit: Payer: Self-pay | Admitting: *Deleted

## 2016-04-09 ENCOUNTER — Ambulatory Visit: Admission: RE | Admit: 2016-04-09 | Payer: Self-pay | Source: Ambulatory Visit

## 2016-04-09 ENCOUNTER — Ambulatory Visit
Admission: RE | Admit: 2016-04-09 | Discharge: 2016-04-09 | Disposition: A | Discharge status: Home / Self Care | Payer: Self-pay | Source: Ambulatory Visit | Attending: *Deleted | Admitting: *Deleted

## 2016-04-09 DIAGNOSIS — N281 Cyst of kidney, acquired: Secondary | ICD-10-CM | POA: Diagnosis not present

## 2016-04-09 DIAGNOSIS — R52 Pain, unspecified: Secondary | ICD-10-CM

## 2016-04-22 DIAGNOSIS — N1330 Unspecified hydronephrosis: Secondary | ICD-10-CM | POA: Diagnosis not present

## 2016-04-22 DIAGNOSIS — N2889 Other specified disorders of kidney and ureter: Secondary | ICD-10-CM | POA: Diagnosis not present

## 2016-04-22 DIAGNOSIS — N281 Cyst of kidney, acquired: Secondary | ICD-10-CM | POA: Diagnosis not present

## 2016-06-29 DIAGNOSIS — L570 Actinic keratosis: Secondary | ICD-10-CM | POA: Diagnosis not present

## 2016-10-14 DIAGNOSIS — H25011 Cortical age-related cataract, right eye: Secondary | ICD-10-CM | POA: Diagnosis not present

## 2016-10-14 DIAGNOSIS — H25043 Posterior subcapsular polar age-related cataract, bilateral: Secondary | ICD-10-CM | POA: Diagnosis not present

## 2016-10-14 DIAGNOSIS — H2511 Age-related nuclear cataract, right eye: Secondary | ICD-10-CM | POA: Diagnosis not present

## 2016-10-14 DIAGNOSIS — H0230 Blepharochalasis unspecified eye, unspecified eyelid: Secondary | ICD-10-CM | POA: Diagnosis not present

## 2016-10-14 DIAGNOSIS — H25041 Posterior subcapsular polar age-related cataract, right eye: Secondary | ICD-10-CM | POA: Diagnosis not present

## 2016-10-14 DIAGNOSIS — H2513 Age-related nuclear cataract, bilateral: Secondary | ICD-10-CM | POA: Diagnosis not present

## 2016-10-14 DIAGNOSIS — H25013 Cortical age-related cataract, bilateral: Secondary | ICD-10-CM | POA: Diagnosis not present

## 2016-10-18 DIAGNOSIS — E782 Mixed hyperlipidemia: Secondary | ICD-10-CM | POA: Diagnosis not present

## 2016-10-18 DIAGNOSIS — Z125 Encounter for screening for malignant neoplasm of prostate: Secondary | ICD-10-CM | POA: Diagnosis not present

## 2016-10-18 DIAGNOSIS — N529 Male erectile dysfunction, unspecified: Secondary | ICD-10-CM | POA: Diagnosis not present

## 2016-10-18 DIAGNOSIS — Z0000 Encounter for general adult medical examination without abnormal findings: Secondary | ICD-10-CM | POA: Diagnosis not present

## 2016-10-18 DIAGNOSIS — E669 Obesity, unspecified: Secondary | ICD-10-CM | POA: Diagnosis not present

## 2016-10-18 DIAGNOSIS — M4800 Spinal stenosis, site unspecified: Secondary | ICD-10-CM | POA: Diagnosis not present

## 2016-10-18 DIAGNOSIS — Z23 Encounter for immunization: Secondary | ICD-10-CM | POA: Diagnosis not present

## 2016-10-18 DIAGNOSIS — R7309 Other abnormal glucose: Secondary | ICD-10-CM | POA: Diagnosis not present

## 2016-10-18 DIAGNOSIS — F322 Major depressive disorder, single episode, severe without psychotic features: Secondary | ICD-10-CM | POA: Diagnosis not present

## 2016-11-03 DIAGNOSIS — R740 Nonspecific elevation of levels of transaminase and lactic acid dehydrogenase [LDH]: Secondary | ICD-10-CM | POA: Diagnosis not present

## 2016-11-29 DIAGNOSIS — H52201 Unspecified astigmatism, right eye: Secondary | ICD-10-CM | POA: Diagnosis not present

## 2016-11-29 DIAGNOSIS — H2511 Age-related nuclear cataract, right eye: Secondary | ICD-10-CM | POA: Diagnosis not present

## 2016-11-30 DIAGNOSIS — H2512 Age-related nuclear cataract, left eye: Secondary | ICD-10-CM | POA: Diagnosis not present

## 2016-12-20 DIAGNOSIS — H52202 Unspecified astigmatism, left eye: Secondary | ICD-10-CM | POA: Diagnosis not present

## 2016-12-20 DIAGNOSIS — H2512 Age-related nuclear cataract, left eye: Secondary | ICD-10-CM | POA: Diagnosis not present

## 2017-01-18 DIAGNOSIS — H0279 Other degenerative disorders of eyelid and periocular area: Secondary | ICD-10-CM | POA: Diagnosis not present

## 2017-01-18 DIAGNOSIS — H02413 Mechanical ptosis of bilateral eyelids: Secondary | ICD-10-CM | POA: Diagnosis not present

## 2017-01-18 DIAGNOSIS — H02831 Dermatochalasis of right upper eyelid: Secondary | ICD-10-CM | POA: Diagnosis not present

## 2017-01-18 DIAGNOSIS — H02834 Dermatochalasis of left upper eyelid: Secondary | ICD-10-CM | POA: Diagnosis not present

## 2017-01-18 DIAGNOSIS — H02423 Myogenic ptosis of bilateral eyelids: Secondary | ICD-10-CM | POA: Diagnosis not present

## 2017-01-18 DIAGNOSIS — H53483 Generalized contraction of visual field, bilateral: Secondary | ICD-10-CM | POA: Diagnosis not present

## 2017-02-14 DIAGNOSIS — H53483 Generalized contraction of visual field, bilateral: Secondary | ICD-10-CM | POA: Diagnosis not present

## 2017-02-14 DIAGNOSIS — H05223 Edema of bilateral orbit: Secondary | ICD-10-CM | POA: Diagnosis not present

## 2017-02-14 DIAGNOSIS — H02413 Mechanical ptosis of bilateral eyelids: Secondary | ICD-10-CM | POA: Diagnosis not present

## 2017-02-14 DIAGNOSIS — H0279 Other degenerative disorders of eyelid and periocular area: Secondary | ICD-10-CM | POA: Diagnosis not present

## 2017-02-14 DIAGNOSIS — H53453 Other localized visual field defect, bilateral: Secondary | ICD-10-CM | POA: Diagnosis not present

## 2017-02-14 DIAGNOSIS — H02834 Dermatochalasis of left upper eyelid: Secondary | ICD-10-CM | POA: Diagnosis not present

## 2017-02-14 DIAGNOSIS — H02831 Dermatochalasis of right upper eyelid: Secondary | ICD-10-CM | POA: Diagnosis not present

## 2017-02-14 DIAGNOSIS — H02423 Myogenic ptosis of bilateral eyelids: Secondary | ICD-10-CM | POA: Diagnosis not present

## 2017-04-26 DIAGNOSIS — E669 Obesity, unspecified: Secondary | ICD-10-CM | POA: Diagnosis not present

## 2017-04-26 DIAGNOSIS — R7309 Other abnormal glucose: Secondary | ICD-10-CM | POA: Diagnosis not present

## 2017-04-26 DIAGNOSIS — Z6832 Body mass index (BMI) 32.0-32.9, adult: Secondary | ICD-10-CM | POA: Diagnosis not present

## 2017-04-26 DIAGNOSIS — E782 Mixed hyperlipidemia: Secondary | ICD-10-CM | POA: Diagnosis not present

## 2017-06-30 DIAGNOSIS — B351 Tinea unguium: Secondary | ICD-10-CM | POA: Diagnosis not present

## 2017-06-30 DIAGNOSIS — L570 Actinic keratosis: Secondary | ICD-10-CM | POA: Diagnosis not present

## 2017-07-05 DIAGNOSIS — S61012A Laceration without foreign body of left thumb without damage to nail, initial encounter: Secondary | ICD-10-CM | POA: Diagnosis not present

## 2017-07-05 DIAGNOSIS — S66222A Laceration of extensor muscle, fascia and tendon of left thumb at wrist and hand level, initial encounter: Secondary | ICD-10-CM | POA: Diagnosis not present

## 2017-07-05 DIAGNOSIS — X58XXXA Exposure to other specified factors, initial encounter: Secondary | ICD-10-CM | POA: Diagnosis not present

## 2017-07-05 DIAGNOSIS — Y999 Unspecified external cause status: Secondary | ICD-10-CM | POA: Diagnosis not present

## 2017-07-18 DIAGNOSIS — S56022A Laceration of flexor muscle, fascia and tendon of left thumb at forearm level, initial encounter: Secondary | ICD-10-CM | POA: Diagnosis not present

## 2017-08-03 DIAGNOSIS — M25642 Stiffness of left hand, not elsewhere classified: Secondary | ICD-10-CM | POA: Diagnosis not present

## 2017-08-16 DIAGNOSIS — M25642 Stiffness of left hand, not elsewhere classified: Secondary | ICD-10-CM | POA: Diagnosis not present

## 2017-09-01 DIAGNOSIS — M25642 Stiffness of left hand, not elsewhere classified: Secondary | ICD-10-CM | POA: Diagnosis not present

## 2017-11-08 DIAGNOSIS — E669 Obesity, unspecified: Secondary | ICD-10-CM | POA: Diagnosis not present

## 2017-11-08 DIAGNOSIS — N529 Male erectile dysfunction, unspecified: Secondary | ICD-10-CM | POA: Diagnosis not present

## 2017-11-08 DIAGNOSIS — Z6831 Body mass index (BMI) 31.0-31.9, adult: Secondary | ICD-10-CM | POA: Diagnosis not present

## 2017-11-08 DIAGNOSIS — E782 Mixed hyperlipidemia: Secondary | ICD-10-CM | POA: Diagnosis not present

## 2017-11-08 DIAGNOSIS — M4800 Spinal stenosis, site unspecified: Secondary | ICD-10-CM | POA: Diagnosis not present

## 2017-11-08 DIAGNOSIS — Z23 Encounter for immunization: Secondary | ICD-10-CM | POA: Diagnosis not present

## 2017-11-08 DIAGNOSIS — F322 Major depressive disorder, single episode, severe without psychotic features: Secondary | ICD-10-CM | POA: Diagnosis not present

## 2017-11-08 DIAGNOSIS — Z125 Encounter for screening for malignant neoplasm of prostate: Secondary | ICD-10-CM | POA: Diagnosis not present

## 2017-11-08 DIAGNOSIS — Z0000 Encounter for general adult medical examination without abnormal findings: Secondary | ICD-10-CM | POA: Diagnosis not present

## 2017-11-08 DIAGNOSIS — R7309 Other abnormal glucose: Secondary | ICD-10-CM | POA: Diagnosis not present

## 2018-02-09 DIAGNOSIS — E7800 Pure hypercholesterolemia, unspecified: Secondary | ICD-10-CM | POA: Diagnosis not present

## 2018-03-02 DIAGNOSIS — H9313 Tinnitus, bilateral: Secondary | ICD-10-CM | POA: Diagnosis not present

## 2018-03-02 DIAGNOSIS — R51 Headache: Secondary | ICD-10-CM | POA: Diagnosis not present

## 2018-04-03 DIAGNOSIS — M25511 Pain in right shoulder: Secondary | ICD-10-CM | POA: Diagnosis not present

## 2018-05-17 DIAGNOSIS — Z6832 Body mass index (BMI) 32.0-32.9, adult: Secondary | ICD-10-CM | POA: Diagnosis not present

## 2018-05-17 DIAGNOSIS — M4800 Spinal stenosis, site unspecified: Secondary | ICD-10-CM | POA: Diagnosis not present

## 2018-05-17 DIAGNOSIS — E669 Obesity, unspecified: Secondary | ICD-10-CM | POA: Diagnosis not present

## 2018-05-17 DIAGNOSIS — E782 Mixed hyperlipidemia: Secondary | ICD-10-CM | POA: Diagnosis not present

## 2018-05-17 DIAGNOSIS — R7309 Other abnormal glucose: Secondary | ICD-10-CM | POA: Diagnosis not present

## 2018-05-17 DIAGNOSIS — R030 Elevated blood-pressure reading, without diagnosis of hypertension: Secondary | ICD-10-CM | POA: Diagnosis not present

## 2018-07-10 DIAGNOSIS — C4442 Squamous cell carcinoma of skin of scalp and neck: Secondary | ICD-10-CM | POA: Diagnosis not present

## 2018-07-10 DIAGNOSIS — L570 Actinic keratosis: Secondary | ICD-10-CM | POA: Diagnosis not present

## 2018-07-10 DIAGNOSIS — L578 Other skin changes due to chronic exposure to nonionizing radiation: Secondary | ICD-10-CM | POA: Diagnosis not present

## 2018-07-10 DIAGNOSIS — B351 Tinea unguium: Secondary | ICD-10-CM | POA: Diagnosis not present

## 2018-11-06 DIAGNOSIS — E782 Mixed hyperlipidemia: Secondary | ICD-10-CM | POA: Diagnosis not present

## 2018-11-06 DIAGNOSIS — Z125 Encounter for screening for malignant neoplasm of prostate: Secondary | ICD-10-CM | POA: Diagnosis not present

## 2018-11-06 DIAGNOSIS — Z23 Encounter for immunization: Secondary | ICD-10-CM | POA: Diagnosis not present

## 2018-11-06 DIAGNOSIS — R7309 Other abnormal glucose: Secondary | ICD-10-CM | POA: Diagnosis not present

## 2018-11-10 DIAGNOSIS — R7309 Other abnormal glucose: Secondary | ICD-10-CM | POA: Diagnosis not present

## 2018-11-10 DIAGNOSIS — Z7189 Other specified counseling: Secondary | ICD-10-CM | POA: Diagnosis not present

## 2018-11-10 DIAGNOSIS — N529 Male erectile dysfunction, unspecified: Secondary | ICD-10-CM | POA: Diagnosis not present

## 2018-11-10 DIAGNOSIS — Z6831 Body mass index (BMI) 31.0-31.9, adult: Secondary | ICD-10-CM | POA: Diagnosis not present

## 2018-11-10 DIAGNOSIS — M4800 Spinal stenosis, site unspecified: Secondary | ICD-10-CM | POA: Diagnosis not present

## 2018-11-10 DIAGNOSIS — Z125 Encounter for screening for malignant neoplasm of prostate: Secondary | ICD-10-CM | POA: Diagnosis not present

## 2018-11-10 DIAGNOSIS — E669 Obesity, unspecified: Secondary | ICD-10-CM | POA: Diagnosis not present

## 2018-11-10 DIAGNOSIS — E782 Mixed hyperlipidemia: Secondary | ICD-10-CM | POA: Diagnosis not present

## 2018-11-10 DIAGNOSIS — Z0000 Encounter for general adult medical examination without abnormal findings: Secondary | ICD-10-CM | POA: Diagnosis not present

## 2018-11-10 DIAGNOSIS — F322 Major depressive disorder, single episode, severe without psychotic features: Secondary | ICD-10-CM | POA: Diagnosis not present

## 2018-11-16 DIAGNOSIS — L570 Actinic keratosis: Secondary | ICD-10-CM | POA: Diagnosis not present

## 2018-11-16 DIAGNOSIS — L578 Other skin changes due to chronic exposure to nonionizing radiation: Secondary | ICD-10-CM | POA: Diagnosis not present

## 2018-11-16 DIAGNOSIS — L821 Other seborrheic keratosis: Secondary | ICD-10-CM | POA: Diagnosis not present

## 2018-11-21 DIAGNOSIS — M545 Low back pain: Secondary | ICD-10-CM | POA: Diagnosis not present

## 2019-01-10 DIAGNOSIS — Z1159 Encounter for screening for other viral diseases: Secondary | ICD-10-CM | POA: Diagnosis not present

## 2019-01-15 DIAGNOSIS — Z86010 Personal history of colonic polyps: Secondary | ICD-10-CM | POA: Diagnosis not present

## 2019-01-15 DIAGNOSIS — D120 Benign neoplasm of cecum: Secondary | ICD-10-CM | POA: Diagnosis not present

## 2019-01-15 DIAGNOSIS — K640 First degree hemorrhoids: Secondary | ICD-10-CM | POA: Diagnosis not present

## 2019-01-17 DIAGNOSIS — D120 Benign neoplasm of cecum: Secondary | ICD-10-CM | POA: Diagnosis not present

## 2019-05-16 DIAGNOSIS — E669 Obesity, unspecified: Secondary | ICD-10-CM | POA: Diagnosis not present

## 2019-05-16 DIAGNOSIS — E782 Mixed hyperlipidemia: Secondary | ICD-10-CM | POA: Diagnosis not present

## 2019-05-16 DIAGNOSIS — R7309 Other abnormal glucose: Secondary | ICD-10-CM | POA: Diagnosis not present

## 2019-05-16 DIAGNOSIS — R030 Elevated blood-pressure reading, without diagnosis of hypertension: Secondary | ICD-10-CM | POA: Diagnosis not present

## 2019-05-16 DIAGNOSIS — M4800 Spinal stenosis, site unspecified: Secondary | ICD-10-CM | POA: Diagnosis not present

## 2019-05-17 DIAGNOSIS — L578 Other skin changes due to chronic exposure to nonionizing radiation: Secondary | ICD-10-CM | POA: Diagnosis not present

## 2019-05-17 DIAGNOSIS — L570 Actinic keratosis: Secondary | ICD-10-CM | POA: Diagnosis not present

## 2019-05-17 DIAGNOSIS — C4442 Squamous cell carcinoma of skin of scalp and neck: Secondary | ICD-10-CM | POA: Diagnosis not present

## 2019-06-19 DIAGNOSIS — Z23 Encounter for immunization: Secondary | ICD-10-CM | POA: Diagnosis not present

## 2019-06-19 DIAGNOSIS — S81012A Laceration without foreign body, left knee, initial encounter: Secondary | ICD-10-CM | POA: Diagnosis not present

## 2019-06-29 DIAGNOSIS — S81012A Laceration without foreign body, left knee, initial encounter: Secondary | ICD-10-CM | POA: Diagnosis not present

## 2019-09-19 DIAGNOSIS — H35373 Puckering of macula, bilateral: Secondary | ICD-10-CM | POA: Diagnosis not present

## 2019-09-19 DIAGNOSIS — Z961 Presence of intraocular lens: Secondary | ICD-10-CM | POA: Diagnosis not present

## 2019-10-15 DIAGNOSIS — Z0000 Encounter for general adult medical examination without abnormal findings: Secondary | ICD-10-CM | POA: Diagnosis not present

## 2019-10-15 DIAGNOSIS — Z6830 Body mass index (BMI) 30.0-30.9, adult: Secondary | ICD-10-CM | POA: Diagnosis not present

## 2019-10-15 DIAGNOSIS — E785 Hyperlipidemia, unspecified: Secondary | ICD-10-CM | POA: Diagnosis not present

## 2019-11-29 DIAGNOSIS — C4442 Squamous cell carcinoma of skin of scalp and neck: Secondary | ICD-10-CM | POA: Diagnosis not present

## 2019-11-29 DIAGNOSIS — L570 Actinic keratosis: Secondary | ICD-10-CM | POA: Diagnosis not present

## 2019-11-29 DIAGNOSIS — L821 Other seborrheic keratosis: Secondary | ICD-10-CM | POA: Diagnosis not present

## 2019-11-29 DIAGNOSIS — L578 Other skin changes due to chronic exposure to nonionizing radiation: Secondary | ICD-10-CM | POA: Diagnosis not present

## 2019-12-12 DIAGNOSIS — Z23 Encounter for immunization: Secondary | ICD-10-CM | POA: Diagnosis not present

## 2020-01-04 DIAGNOSIS — N529 Male erectile dysfunction, unspecified: Secondary | ICD-10-CM | POA: Diagnosis not present

## 2020-01-04 DIAGNOSIS — R7309 Other abnormal glucose: Secondary | ICD-10-CM | POA: Diagnosis not present

## 2020-01-04 DIAGNOSIS — M4800 Spinal stenosis, site unspecified: Secondary | ICD-10-CM | POA: Diagnosis not present

## 2020-01-04 DIAGNOSIS — Z7189 Other specified counseling: Secondary | ICD-10-CM | POA: Diagnosis not present

## 2020-01-04 DIAGNOSIS — F322 Major depressive disorder, single episode, severe without psychotic features: Secondary | ICD-10-CM | POA: Diagnosis not present

## 2020-01-04 DIAGNOSIS — Z125 Encounter for screening for malignant neoplasm of prostate: Secondary | ICD-10-CM | POA: Diagnosis not present

## 2020-01-04 DIAGNOSIS — E669 Obesity, unspecified: Secondary | ICD-10-CM | POA: Diagnosis not present

## 2020-01-04 DIAGNOSIS — E782 Mixed hyperlipidemia: Secondary | ICD-10-CM | POA: Diagnosis not present

## 2020-01-04 DIAGNOSIS — Z0000 Encounter for general adult medical examination without abnormal findings: Secondary | ICD-10-CM | POA: Diagnosis not present

## 2020-07-07 DIAGNOSIS — R7309 Other abnormal glucose: Secondary | ICD-10-CM | POA: Diagnosis not present

## 2020-07-07 DIAGNOSIS — E782 Mixed hyperlipidemia: Secondary | ICD-10-CM | POA: Diagnosis not present

## 2020-07-07 DIAGNOSIS — M4800 Spinal stenosis, site unspecified: Secondary | ICD-10-CM | POA: Diagnosis not present

## 2020-07-07 DIAGNOSIS — E669 Obesity, unspecified: Secondary | ICD-10-CM | POA: Diagnosis not present

## 2021-01-12 DIAGNOSIS — M4800 Spinal stenosis, site unspecified: Secondary | ICD-10-CM | POA: Diagnosis not present

## 2021-01-12 DIAGNOSIS — Z125 Encounter for screening for malignant neoplasm of prostate: Secondary | ICD-10-CM | POA: Diagnosis not present

## 2021-01-12 DIAGNOSIS — Z23 Encounter for immunization: Secondary | ICD-10-CM | POA: Diagnosis not present

## 2021-01-12 DIAGNOSIS — E669 Obesity, unspecified: Secondary | ICD-10-CM | POA: Diagnosis not present

## 2021-01-12 DIAGNOSIS — F322 Major depressive disorder, single episode, severe without psychotic features: Secondary | ICD-10-CM | POA: Diagnosis not present

## 2021-01-12 DIAGNOSIS — R7309 Other abnormal glucose: Secondary | ICD-10-CM | POA: Diagnosis not present

## 2021-01-12 DIAGNOSIS — E782 Mixed hyperlipidemia: Secondary | ICD-10-CM | POA: Diagnosis not present

## 2021-01-12 DIAGNOSIS — Z0000 Encounter for general adult medical examination without abnormal findings: Secondary | ICD-10-CM | POA: Diagnosis not present

## 2021-01-12 DIAGNOSIS — N529 Male erectile dysfunction, unspecified: Secondary | ICD-10-CM | POA: Diagnosis not present

## 2021-04-13 DIAGNOSIS — M4800 Spinal stenosis, site unspecified: Secondary | ICD-10-CM | POA: Diagnosis not present

## 2021-04-13 DIAGNOSIS — M1990 Unspecified osteoarthritis, unspecified site: Secondary | ICD-10-CM | POA: Diagnosis not present

## 2021-05-08 DIAGNOSIS — M5451 Vertebrogenic low back pain: Secondary | ICD-10-CM | POA: Diagnosis not present

## 2021-07-28 DIAGNOSIS — J309 Allergic rhinitis, unspecified: Secondary | ICD-10-CM | POA: Diagnosis not present

## 2021-07-28 DIAGNOSIS — H6903 Patulous Eustachian tube, bilateral: Secondary | ICD-10-CM | POA: Diagnosis not present

## 2021-07-28 DIAGNOSIS — Z6831 Body mass index (BMI) 31.0-31.9, adult: Secondary | ICD-10-CM | POA: Diagnosis not present

## 2021-08-11 DIAGNOSIS — H35373 Puckering of macula, bilateral: Secondary | ICD-10-CM | POA: Diagnosis not present

## 2021-08-11 DIAGNOSIS — H26491 Other secondary cataract, right eye: Secondary | ICD-10-CM | POA: Diagnosis not present

## 2021-08-11 DIAGNOSIS — Z961 Presence of intraocular lens: Secondary | ICD-10-CM | POA: Diagnosis not present

## 2021-10-13 DIAGNOSIS — J209 Acute bronchitis, unspecified: Secondary | ICD-10-CM | POA: Diagnosis not present

## 2021-11-05 DIAGNOSIS — H40013 Open angle with borderline findings, low risk, bilateral: Secondary | ICD-10-CM | POA: Diagnosis not present

## 2021-11-05 DIAGNOSIS — Z961 Presence of intraocular lens: Secondary | ICD-10-CM | POA: Diagnosis not present

## 2021-11-05 DIAGNOSIS — H26491 Other secondary cataract, right eye: Secondary | ICD-10-CM | POA: Diagnosis not present

## 2021-11-05 DIAGNOSIS — H26493 Other secondary cataract, bilateral: Secondary | ICD-10-CM | POA: Diagnosis not present

## 2021-11-05 DIAGNOSIS — H35373 Puckering of macula, bilateral: Secondary | ICD-10-CM | POA: Diagnosis not present

## 2021-11-05 DIAGNOSIS — H18413 Arcus senilis, bilateral: Secondary | ICD-10-CM | POA: Diagnosis not present

## 2021-11-16 DIAGNOSIS — Z23 Encounter for immunization: Secondary | ICD-10-CM | POA: Diagnosis not present

## 2022-01-19 DIAGNOSIS — R350 Frequency of micturition: Secondary | ICD-10-CM | POA: Diagnosis not present

## 2022-01-19 DIAGNOSIS — E669 Obesity, unspecified: Secondary | ICD-10-CM | POA: Diagnosis not present

## 2022-01-19 DIAGNOSIS — Z0000 Encounter for general adult medical examination without abnormal findings: Secondary | ICD-10-CM | POA: Diagnosis not present

## 2022-01-19 DIAGNOSIS — M4800 Spinal stenosis, site unspecified: Secondary | ICD-10-CM | POA: Diagnosis not present

## 2022-01-19 DIAGNOSIS — E782 Mixed hyperlipidemia: Secondary | ICD-10-CM | POA: Diagnosis not present

## 2022-01-19 DIAGNOSIS — F322 Major depressive disorder, single episode, severe without psychotic features: Secondary | ICD-10-CM | POA: Diagnosis not present

## 2022-01-19 DIAGNOSIS — R7309 Other abnormal glucose: Secondary | ICD-10-CM | POA: Diagnosis not present

## 2022-01-19 DIAGNOSIS — N529 Male erectile dysfunction, unspecified: Secondary | ICD-10-CM | POA: Diagnosis not present

## 2022-01-19 DIAGNOSIS — Z125 Encounter for screening for malignant neoplasm of prostate: Secondary | ICD-10-CM | POA: Diagnosis not present

## 2022-04-14 DIAGNOSIS — L301 Dyshidrosis [pompholyx]: Secondary | ICD-10-CM | POA: Diagnosis not present

## 2022-04-14 DIAGNOSIS — R4584 Anhedonia: Secondary | ICD-10-CM | POA: Diagnosis not present

## 2022-04-14 DIAGNOSIS — L97509 Non-pressure chronic ulcer of other part of unspecified foot with unspecified severity: Secondary | ICD-10-CM | POA: Diagnosis not present

## 2022-04-16 DIAGNOSIS — Z5331 Laparoscopic surgical procedure converted to open procedure: Secondary | ICD-10-CM | POA: Diagnosis not present

## 2022-04-16 DIAGNOSIS — K219 Gastro-esophageal reflux disease without esophagitis: Secondary | ICD-10-CM | POA: Diagnosis not present

## 2022-04-16 DIAGNOSIS — K3532 Acute appendicitis with perforation and localized peritonitis, without abscess: Secondary | ICD-10-CM | POA: Diagnosis not present

## 2022-04-16 DIAGNOSIS — K5900 Constipation, unspecified: Secondary | ICD-10-CM | POA: Diagnosis not present

## 2022-04-16 DIAGNOSIS — R109 Unspecified abdominal pain: Secondary | ICD-10-CM | POA: Diagnosis not present

## 2022-04-16 DIAGNOSIS — N281 Cyst of kidney, acquired: Secondary | ICD-10-CM | POA: Diagnosis not present

## 2022-04-16 DIAGNOSIS — R0602 Shortness of breath: Secondary | ICD-10-CM | POA: Diagnosis not present

## 2022-04-16 DIAGNOSIS — I4510 Unspecified right bundle-branch block: Secondary | ICD-10-CM | POA: Diagnosis not present

## 2022-04-16 DIAGNOSIS — K353 Acute appendicitis with localized peritonitis, without perforation or gangrene: Secondary | ICD-10-CM | POA: Diagnosis not present

## 2022-04-16 DIAGNOSIS — K760 Fatty (change of) liver, not elsewhere classified: Secondary | ICD-10-CM | POA: Diagnosis not present

## 2022-04-16 DIAGNOSIS — Z5189 Encounter for other specified aftercare: Secondary | ICD-10-CM | POA: Diagnosis not present

## 2022-04-16 DIAGNOSIS — K3580 Unspecified acute appendicitis: Secondary | ICD-10-CM | POA: Diagnosis not present

## 2022-04-16 DIAGNOSIS — I440 Atrioventricular block, first degree: Secondary | ICD-10-CM | POA: Diagnosis not present

## 2022-04-16 DIAGNOSIS — Z7982 Long term (current) use of aspirin: Secondary | ICD-10-CM | POA: Diagnosis not present

## 2022-04-16 DIAGNOSIS — E7800 Pure hypercholesterolemia, unspecified: Secondary | ICD-10-CM | POA: Diagnosis not present

## 2022-04-16 DIAGNOSIS — E872 Acidosis, unspecified: Secondary | ICD-10-CM | POA: Diagnosis not present

## 2022-04-17 DIAGNOSIS — K3532 Acute appendicitis with perforation and localized peritonitis, without abscess: Secondary | ICD-10-CM | POA: Diagnosis not present

## 2022-04-21 DIAGNOSIS — Z4889 Encounter for other specified surgical aftercare: Secondary | ICD-10-CM | POA: Diagnosis not present

## 2022-04-23 DIAGNOSIS — Z9049 Acquired absence of other specified parts of digestive tract: Secondary | ICD-10-CM | POA: Diagnosis not present

## 2022-04-23 DIAGNOSIS — M720 Palmar fascial fibromatosis [Dupuytren]: Secondary | ICD-10-CM | POA: Diagnosis not present

## 2022-04-23 DIAGNOSIS — D72829 Elevated white blood cell count, unspecified: Secondary | ICD-10-CM | POA: Diagnosis not present

## 2022-04-23 DIAGNOSIS — Z7982 Long term (current) use of aspirin: Secondary | ICD-10-CM | POA: Diagnosis not present

## 2022-04-23 DIAGNOSIS — K760 Fatty (change of) liver, not elsewhere classified: Secondary | ICD-10-CM | POA: Diagnosis not present

## 2022-04-23 DIAGNOSIS — K219 Gastro-esophageal reflux disease without esophagitis: Secondary | ICD-10-CM | POA: Diagnosis not present

## 2022-04-23 DIAGNOSIS — K5900 Constipation, unspecified: Secondary | ICD-10-CM | POA: Diagnosis not present

## 2022-04-23 DIAGNOSIS — E7800 Pure hypercholesterolemia, unspecified: Secondary | ICD-10-CM | POA: Diagnosis not present

## 2022-04-23 DIAGNOSIS — Z48815 Encounter for surgical aftercare following surgery on the digestive system: Secondary | ICD-10-CM | POA: Diagnosis not present

## 2022-04-27 DIAGNOSIS — Z09 Encounter for follow-up examination after completed treatment for conditions other than malignant neoplasm: Secondary | ICD-10-CM | POA: Diagnosis not present

## 2022-05-04 DIAGNOSIS — Z09 Encounter for follow-up examination after completed treatment for conditions other than malignant neoplasm: Secondary | ICD-10-CM | POA: Diagnosis not present

## 2022-05-11 DIAGNOSIS — Z09 Encounter for follow-up examination after completed treatment for conditions other than malignant neoplasm: Secondary | ICD-10-CM | POA: Diagnosis not present

## 2022-05-18 DIAGNOSIS — Z09 Encounter for follow-up examination after completed treatment for conditions other than malignant neoplasm: Secondary | ICD-10-CM | POA: Diagnosis not present

## 2022-07-05 DIAGNOSIS — F325 Major depressive disorder, single episode, in full remission: Secondary | ICD-10-CM | POA: Diagnosis not present

## 2022-07-05 DIAGNOSIS — L301 Dyshidrosis [pompholyx]: Secondary | ICD-10-CM | POA: Diagnosis not present

## 2022-07-05 DIAGNOSIS — R4584 Anhedonia: Secondary | ICD-10-CM | POA: Diagnosis not present

## 2022-07-15 DIAGNOSIS — B353 Tinea pedis: Secondary | ICD-10-CM | POA: Diagnosis not present

## 2022-08-02 DIAGNOSIS — L570 Actinic keratosis: Secondary | ICD-10-CM | POA: Diagnosis not present

## 2022-08-02 DIAGNOSIS — B351 Tinea unguium: Secondary | ICD-10-CM | POA: Diagnosis not present

## 2022-08-02 DIAGNOSIS — L578 Other skin changes due to chronic exposure to nonionizing radiation: Secondary | ICD-10-CM | POA: Diagnosis not present

## 2022-08-02 DIAGNOSIS — L814 Other melanin hyperpigmentation: Secondary | ICD-10-CM | POA: Diagnosis not present

## 2023-01-24 DIAGNOSIS — F3341 Major depressive disorder, recurrent, in partial remission: Secondary | ICD-10-CM | POA: Diagnosis not present

## 2023-01-24 DIAGNOSIS — Z0000 Encounter for general adult medical examination without abnormal findings: Secondary | ICD-10-CM | POA: Diagnosis not present

## 2023-01-24 DIAGNOSIS — N529 Male erectile dysfunction, unspecified: Secondary | ICD-10-CM | POA: Diagnosis not present

## 2023-01-24 DIAGNOSIS — Z9181 History of falling: Secondary | ICD-10-CM | POA: Diagnosis not present

## 2023-01-24 DIAGNOSIS — R7309 Other abnormal glucose: Secondary | ICD-10-CM | POA: Diagnosis not present

## 2023-01-24 DIAGNOSIS — Z125 Encounter for screening for malignant neoplasm of prostate: Secondary | ICD-10-CM | POA: Diagnosis not present

## 2023-01-24 DIAGNOSIS — E669 Obesity, unspecified: Secondary | ICD-10-CM | POA: Diagnosis not present

## 2023-01-24 DIAGNOSIS — E782 Mixed hyperlipidemia: Secondary | ICD-10-CM | POA: Diagnosis not present

## 2023-01-24 DIAGNOSIS — M4800 Spinal stenosis, site unspecified: Secondary | ICD-10-CM | POA: Diagnosis not present

## 2023-02-11 DIAGNOSIS — B351 Tinea unguium: Secondary | ICD-10-CM | POA: Diagnosis not present

## 2023-02-11 DIAGNOSIS — L299 Pruritus, unspecified: Secondary | ICD-10-CM | POA: Diagnosis not present

## 2023-03-02 DIAGNOSIS — B353 Tinea pedis: Secondary | ICD-10-CM | POA: Diagnosis not present

## 2024-02-16 ENCOUNTER — Other Ambulatory Visit (HOSPITAL_BASED_OUTPATIENT_CLINIC_OR_DEPARTMENT_OTHER): Payer: Self-pay | Admitting: Urology

## 2024-02-16 DIAGNOSIS — C61 Malignant neoplasm of prostate: Secondary | ICD-10-CM

## 2024-03-05 ENCOUNTER — Inpatient Hospital Stay (HOSPITAL_BASED_OUTPATIENT_CLINIC_OR_DEPARTMENT_OTHER): Admission: RE | Admit: 2024-03-05 | Source: Ambulatory Visit | Admitting: Radiology

## 2024-03-19 ENCOUNTER — Other Ambulatory Visit (HOSPITAL_BASED_OUTPATIENT_CLINIC_OR_DEPARTMENT_OTHER): Admitting: Radiology
